# Patient Record
Sex: Female | Born: 1980 | Race: White | Hispanic: No | Marital: Married | Smoking: Never smoker
Health system: Southern US, Community
[De-identification: ages and names within clinical notes are randomized; demographics above are authoritative.]

## PROBLEM LIST (undated history)

## (undated) DIAGNOSIS — J45909 Unspecified asthma, uncomplicated: Secondary | ICD-10-CM

## (undated) DIAGNOSIS — A630 Anogenital (venereal) warts: Secondary | ICD-10-CM

## (undated) DIAGNOSIS — F419 Anxiety disorder, unspecified: Secondary | ICD-10-CM

## (undated) DIAGNOSIS — R87629 Unspecified abnormal cytological findings in specimens from vagina: Secondary | ICD-10-CM

## (undated) DIAGNOSIS — Z789 Other specified health status: Secondary | ICD-10-CM

## (undated) HISTORY — PX: NO PAST SURGERIES: SHX2092

## (undated) HISTORY — DX: Unspecified asthma, uncomplicated: J45.909

## (undated) HISTORY — DX: Anogenital (venereal) warts: A63.0

## (undated) HISTORY — DX: Unspecified abnormal cytological findings in specimens from vagina: R87.629

## (undated) HISTORY — DX: Anxiety disorder, unspecified: F41.9

---
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
## 2004-02-18 ENCOUNTER — Encounter: Admission: RE | Admit: 2004-02-18 | Discharge: 2004-03-31 | Payer: Self-pay | Admitting: Orthopedic Surgery

## 2006-06-28 ENCOUNTER — Other Ambulatory Visit: Admission: RE | Admit: 2006-06-28 | Discharge: 2006-06-28 | Payer: Self-pay | Admitting: Obstetrics & Gynecology

## 2007-08-22 ENCOUNTER — Other Ambulatory Visit: Admission: RE | Admit: 2007-08-22 | Discharge: 2007-08-22 | Payer: Self-pay | Admitting: Obstetrics and Gynecology

## 2009-06-25 ENCOUNTER — Inpatient Hospital Stay (HOSPITAL_COMMUNITY): Admission: AD | Admit: 2009-06-25 | Discharge: 2009-06-27 | Payer: Self-pay | Admitting: Obstetrics and Gynecology

## 2010-07-19 LAB — OB RESULTS CONSOLE GC/CHLAMYDIA: Chlamydia: NEGATIVE

## 2010-08-14 LAB — CBC
HCT: 38 % (ref 36.0–46.0)
HCT: 45.6 % (ref 36.0–46.0)
Hemoglobin: 12.9 g/dL (ref 12.0–15.0)
Hemoglobin: 15.1 g/dL — ABNORMAL HIGH (ref 12.0–15.0)
MCHC: 33 g/dL (ref 30.0–36.0)
MCHC: 33.9 g/dL (ref 30.0–36.0)
MCV: 100.1 fL — ABNORMAL HIGH (ref 78.0–100.0)
MCV: 99.3 fL (ref 78.0–100.0)
Platelets: 174 10*3/uL (ref 150–400)
Platelets: 176 10*3/uL (ref 150–400)
RBC: 3.82 MIL/uL — ABNORMAL LOW (ref 3.87–5.11)
RBC: 4.56 MIL/uL (ref 3.87–5.11)
RDW: 13 % (ref 11.5–15.5)
RDW: 13.2 % (ref 11.5–15.5)
WBC: 17.5 10*3/uL — ABNORMAL HIGH (ref 4.0–10.5)
WBC: 22.8 10*3/uL — ABNORMAL HIGH (ref 4.0–10.5)

## 2010-08-14 LAB — RPR: RPR Ser Ql: NONREACTIVE

## 2010-10-13 NOTE — Op Note (Signed)
[place]. [facility]  Patient:    Maria Salinas, Maria Salinas                       MRN: [id number] Proc. Date: 12/10/00 Adm. Date:  [id number] Attending:  Ronne Binning                           Operative Report  PREOPERATIVE DIAGNOSIS:  Flexor sheath cyst, left ring finger.  POSTOPERATIVE DIAGNOSIS:  Flexor sheath cyst, left ring finger.  PROCEDURE:  Excision flexor sheath cyst, left ring finger.  SURGEON:  Nicki Reaper, M.D.  ASSISTANT:  Joaquin Courts, R.N.  ANESTHESIA:  Forearm-based IV regional.  ANESTHESIOLOGIST:  Halford Decamp, M.D.  HISTORY:  The patient is an 30 year old female with a history of mass in the volar aspect of the metacarpophalangeal joint of her left ring finger, which has not responded to conservative treatment.  DESCRIPTION OF PROCEDURE:  The patient was brought to the operating room, where a forearm-based IV regional anesthetic was carried out without difficulty.  She was prepped and draped using Betadine scrub and solution, the left arm free.  An oblique incision was made over the A1 pulley, carried down through subcutaneous tissue, neurovascular structures identified and protected.  The cyst was immediately encountered, and this was at the proximal aspect of the A1 pulley.  This entire portion of the pulley and cyst was excised.  The finger was placed through a full range of motion.  No further triggering was noted.  A small incision was made in the central aspect longitudinally in the remainder of the A1 pulley.  The wound was copiously irrigated with saline and the skin closed with interrupted 5-0 nylon suture. Sterile compressive dressing was applied.  The patient tolerated the procedure well and was taken to the recovery room for observation in satisfactory condition.  She is discharged to home, to return to the [facility] [place] in one week, on Vicodin and Keflex. DD:  12/10/00 TD:  12/10/00 Job:  [id number]

## 2010-11-06 LAB — OB RESULTS CONSOLE GC/CHLAMYDIA
Chlamydia: NEGATIVE
Gonorrhea: NEGATIVE

## 2010-11-06 LAB — OB RESULTS CONSOLE HEPATITIS B SURFACE ANTIGEN: Hepatitis B Surface Ag: NEGATIVE

## 2010-11-06 LAB — OB RESULTS CONSOLE RPR: RPR: NONREACTIVE

## 2010-11-06 LAB — OB RESULTS CONSOLE ABO/RH: RH Type: POSITIVE

## 2010-11-06 LAB — OB RESULTS CONSOLE RUBELLA ANTIBODY, IGM: Rubella: IMMUNE

## 2011-05-29 NOTE — L&D Delivery Note (Signed)
Delivery Note  a viable female was delivered via  OA.  APGAR: 9 9  Placenta status: delivered spontaneously , intact with 3 vessel cord.  Cord:  with the following complications: none.  Cord pH: not done  Anesthesia: Epidural  Episiotomy: none Lacerations: none Suture Repair: not applicable Est. Blood Loss (mL): 300  Mom to postpartum.  Baby to nursery-stable.  Kiaya Haliburton L 03/02/2012, 1:40 PM

## 2011-07-20 LAB — OB RESULTS CONSOLE ABO/RH

## 2011-07-20 LAB — OB RESULTS CONSOLE ANTIBODY SCREEN: Antibody Screen: NEGATIVE

## 2011-08-10 LAB — OB RESULTS CONSOLE GC/CHLAMYDIA
Chlamydia: NEGATIVE
Gonorrhea: NEGATIVE

## 2011-11-06 LAB — OB RESULTS CONSOLE ANTIBODY SCREEN: Antibody Screen: NEGATIVE

## 2011-11-06 LAB — OB RESULTS CONSOLE GC/CHLAMYDIA: Chlamydia: NEGATIVE

## 2011-11-06 LAB — OB RESULTS CONSOLE RUBELLA ANTIBODY, IGM: Rubella: IMMUNE

## 2011-11-06 LAB — OB RESULTS CONSOLE ABO/RH: RH Type: POSITIVE

## 2011-11-06 LAB — OB RESULTS CONSOLE HEPATITIS B SURFACE ANTIGEN: Hepatitis B Surface Ag: NEGATIVE

## 2012-03-02 ENCOUNTER — Inpatient Hospital Stay (HOSPITAL_COMMUNITY): Payer: 59 | Admitting: Anesthesiology

## 2012-03-02 ENCOUNTER — Encounter (HOSPITAL_COMMUNITY): Payer: Self-pay | Admitting: Anesthesiology

## 2012-03-02 ENCOUNTER — Encounter (HOSPITAL_COMMUNITY): Payer: Self-pay | Admitting: Obstetrics and Gynecology

## 2012-03-02 ENCOUNTER — Inpatient Hospital Stay (HOSPITAL_COMMUNITY)
Admission: AD | Admit: 2012-03-02 | Discharge: 2012-03-03 | DRG: 775 | Disposition: A | Discharge status: Home / Self Care | Payer: 59 | Source: Ambulatory Visit | Attending: Obstetrics and Gynecology | Admitting: Obstetrics and Gynecology

## 2012-03-02 DIAGNOSIS — O99892 Other specified diseases and conditions complicating childbirth: Principal | ICD-10-CM | POA: Diagnosis present

## 2012-03-02 DIAGNOSIS — Z22330 Carrier of Group B streptococcus: Secondary | ICD-10-CM

## 2012-03-02 HISTORY — DX: Other specified health status: Z78.9

## 2012-03-02 LAB — CBC
MCH: 31.8 pg (ref 26.0–34.0)
MCV: 96.4 fL (ref 78.0–100.0)
Platelets: 161 10*3/uL (ref 150–400)
RDW: 13 % (ref 11.5–15.5)

## 2012-03-02 MED ORDER — TETANUS-DIPHTH-ACELL PERTUSSIS 5-2.5-18.5 LF-MCG/0.5 IM SUSP: 0.5000 mL | Freq: Once | INTRAMUSCULAR | Status: DC

## 2012-03-02 MED ORDER — LACTATED RINGERS IV SOLN
INTRAVENOUS | Status: DC
  Administered 2012-03-02: 09:00:00 via INTRAVENOUS
  Administered 2012-03-02: 125 mL via INTRAVENOUS

## 2012-03-02 MED ORDER — OXYCODONE-ACETAMINOPHEN 5-325 MG PO TABS: 1.0000 | ORAL_TABLET | ORAL | Status: DC | PRN

## 2012-03-02 MED ORDER — SENNOSIDES-DOCUSATE SODIUM 8.6-50 MG PO TABS
2.0000 | ORAL_TABLET | Freq: Every day | ORAL | Status: DC
  Administered 2012-03-02: 2 via ORAL

## 2012-03-02 MED ORDER — OXYTOCIN 40 UNITS IN LACTATED RINGERS INFUSION - SIMPLE MED
62.5000 mL/h | Freq: Once | INTRAVENOUS | Status: AC
  Administered 2012-03-02: 62.5 mL/h via INTRAVENOUS
  Filled 2012-03-02: qty 1000

## 2012-03-02 MED ORDER — LANOLIN HYDROUS EX OINT: TOPICAL_OINTMENT | CUTANEOUS | Status: DC | PRN

## 2012-03-02 MED ORDER — MEASLES, MUMPS & RUBELLA VAC ~~LOC~~ INJ
0.5000 mL | INJECTION | Freq: Once | SUBCUTANEOUS | Status: DC
  Filled 2012-03-02: qty 0.5

## 2012-03-02 MED ORDER — FENTANYL 2.5 MCG/ML BUPIVACAINE 1/10 % EPIDURAL INFUSION (WH - ANES)
14.0000 mL/h | INTRAMUSCULAR | Status: DC
  Filled 2012-03-02: qty 123

## 2012-03-02 MED ORDER — CITRIC ACID-SODIUM CITRATE 334-500 MG/5ML PO SOLN: 30.0000 mL | ORAL | Status: DC | PRN

## 2012-03-02 MED ORDER — BISACODYL 10 MG RE SUPP: 10.0000 mg | Freq: Every day | RECTAL | Status: DC | PRN

## 2012-03-02 MED ORDER — INFLUENZA VIRUS VACC SPLIT PF IM SUSP
0.5000 mL | INTRAMUSCULAR | Status: AC
  Administered 2012-03-03: 0.5 mL via INTRAMUSCULAR
  Filled 2012-03-02: qty 0.5

## 2012-03-02 MED ORDER — DIPHENHYDRAMINE HCL 50 MG/ML IJ SOLN: 12.5000 mg | INTRAMUSCULAR | Status: DC | PRN

## 2012-03-02 MED ORDER — SODIUM BICARBONATE 8.4 % IV SOLN
INTRAVENOUS | Status: DC | PRN
  Administered 2012-03-02: 5 mL via EPIDURAL

## 2012-03-02 MED ORDER — OXYTOCIN BOLUS FROM INFUSION
500.0000 mL | Freq: Once | INTRAVENOUS | Status: AC
  Administered 2012-03-02: 500 mL via INTRAVENOUS
  Filled 2012-03-02: qty 500

## 2012-03-02 MED ORDER — PHENYLEPHRINE 40 MCG/ML (10ML) SYRINGE FOR IV PUSH (FOR BLOOD PRESSURE SUPPORT): 80.0000 ug | PREFILLED_SYRINGE | INTRAVENOUS | Status: DC | PRN

## 2012-03-02 MED ORDER — LACTATED RINGERS IV SOLN: 500.0000 mL | Freq: Once | INTRAVENOUS | Status: DC

## 2012-03-02 MED ORDER — SIMETHICONE 80 MG PO CHEW: 80.0000 mg | CHEWABLE_TABLET | ORAL | Status: DC | PRN

## 2012-03-02 MED ORDER — ACETAMINOPHEN 325 MG PO TABS: 650.0000 mg | ORAL_TABLET | ORAL | Status: DC | PRN

## 2012-03-02 MED ORDER — ONDANSETRON HCL 4 MG PO TABS: 4.0000 mg | ORAL_TABLET | ORAL | Status: DC | PRN

## 2012-03-02 MED ORDER — PRENATAL MULTIVITAMIN CH
1.0000 | ORAL_TABLET | Freq: Every day | ORAL | Status: DC
  Administered 2012-03-02 – 2012-03-03 (×2): 1 via ORAL
  Filled 2012-03-02 (×2): qty 1

## 2012-03-02 MED ORDER — IBUPROFEN 600 MG PO TABS
600.0000 mg | ORAL_TABLET | Freq: Four times a day (QID) | ORAL | Status: DC
  Administered 2012-03-02 – 2012-03-03 (×5): 600 mg via ORAL
  Filled 2012-03-02 (×4): qty 1

## 2012-03-02 MED ORDER — LACTATED RINGERS IV SOLN: 500.0000 mL | INTRAVENOUS | Status: DC | PRN

## 2012-03-02 MED ORDER — IBUPROFEN 600 MG PO TABS
600.0000 mg | ORAL_TABLET | Freq: Four times a day (QID) | ORAL | Status: DC | PRN
  Filled 2012-03-02: qty 1

## 2012-03-02 MED ORDER — MEDROXYPROGESTERONE ACETATE 150 MG/ML IM SUSP: 150.0000 mg | INTRAMUSCULAR | Status: DC | PRN

## 2012-03-02 MED ORDER — BENZOCAINE-MENTHOL 20-0.5 % EX AERO
1.0000 "application " | INHALATION_SPRAY | CUTANEOUS | Status: DC | PRN
  Administered 2012-03-03: 1 via TOPICAL
  Filled 2012-03-02: qty 56

## 2012-03-02 MED ORDER — FENTANYL 2.5 MCG/ML BUPIVACAINE 1/10 % EPIDURAL INFUSION (WH - ANES)
INTRAMUSCULAR | Status: DC | PRN
  Administered 2012-03-02: 14 mL/h via EPIDURAL

## 2012-03-02 MED ORDER — EPHEDRINE 5 MG/ML INJ: 10.0000 mg | INTRAVENOUS | Status: DC | PRN

## 2012-03-02 MED ORDER — ONDANSETRON HCL 4 MG/2ML IJ SOLN: 4.0000 mg | INTRAMUSCULAR | Status: DC | PRN

## 2012-03-02 MED ORDER — FLEET ENEMA 7-19 GM/118ML RE ENEM: 1.0000 | ENEMA | Freq: Every day | RECTAL | Status: DC | PRN

## 2012-03-02 MED ORDER — WITCH HAZEL-GLYCERIN EX PADS: 1.0000 "application " | MEDICATED_PAD | CUTANEOUS | Status: DC | PRN

## 2012-03-02 MED ORDER — ONDANSETRON HCL 4 MG/2ML IJ SOLN: 4.0000 mg | Freq: Four times a day (QID) | INTRAMUSCULAR | Status: DC | PRN

## 2012-03-02 MED ORDER — LIDOCAINE HCL (PF) 1 % IJ SOLN
30.0000 mL | INTRAMUSCULAR | Status: DC | PRN
  Filled 2012-03-02: qty 30

## 2012-03-02 MED ORDER — DIBUCAINE 1 % RE OINT: 1.0000 "application " | TOPICAL_OINTMENT | RECTAL | Status: DC | PRN

## 2012-03-02 MED ORDER — SODIUM CHLORIDE 0.9 % IV SOLN
2.0000 g | Freq: Four times a day (QID) | INTRAVENOUS | Status: DC
  Administered 2012-03-02: 2 g via INTRAVENOUS
  Filled 2012-03-02 (×4): qty 2000

## 2012-03-02 MED ORDER — ZOLPIDEM TARTRATE 5 MG PO TABS: 5.0000 mg | ORAL_TABLET | Freq: Every evening | ORAL | Status: DC | PRN

## 2012-03-02 MED ORDER — CLINDAMYCIN PHOSPHATE 900 MG/50ML IV SOLN: 900.0000 mg | Freq: Once | INTRAVENOUS | Status: DC

## 2012-03-02 MED ORDER — PHENYLEPHRINE 40 MCG/ML (10ML) SYRINGE FOR IV PUSH (FOR BLOOD PRESSURE SUPPORT)
80.0000 ug | PREFILLED_SYRINGE | INTRAVENOUS | Status: DC | PRN
  Filled 2012-03-02: qty 5

## 2012-03-02 MED ORDER — DIPHENHYDRAMINE HCL 25 MG PO CAPS: 25.0000 mg | ORAL_CAPSULE | Freq: Four times a day (QID) | ORAL | Status: DC | PRN

## 2012-03-02 MED ORDER — EPHEDRINE 5 MG/ML INJ
10.0000 mg | INTRAVENOUS | Status: DC | PRN
  Filled 2012-03-02: qty 4

## 2012-03-02 NOTE — Progress Notes (Signed)
Pt placed on bedpan

## 2012-03-02 NOTE — Anesthesia Postprocedure Evaluation (Signed)
  Anesthesia Post-op Note  Patient: Retail banker  Procedure(s) Performed: * No procedures listed *  Patient Location: PACU and Mother/Baby  Anesthesia Type: Epidural  Level of Consciousness: awake, alert  and oriented  Airway and Oxygen Therapy: Patient Spontanous Breathing    Post-op Assessment: Patient's Cardiovascular Status Stable and Respiratory Function Stable  Post-op Vital Signs: stable  Complications: No apparent anesthesia complications

## 2012-03-02 NOTE — Progress Notes (Signed)
Up to bathroom with steady 

## 2012-03-02 NOTE — Anesthesia Preprocedure Evaluation (Signed)

## 2012-03-02 NOTE — Progress Notes (Signed)
31 year old G 2 P 1001 at 40 w 5 days presents with contractions PNC see [facility] GBBS +  NSVD x 1  Afebrile Vital signs stable General alert and oriented Lung CTAB Car RRR Abdomen is soft and non tender Pelvic is 100%/5 / - 1 Vertex BOWI  Impression: IUP at 40 w 5 days Labor GBBS +  Plan: Admit Epidural AROM Ampicillin

## 2012-03-02 NOTE — Progress Notes (Signed)
800 cc urine

## 2012-03-02 NOTE — Progress Notes (Signed)
Pt used restroom prior to monitors placed

## 2012-03-02 NOTE — Anesthesia Procedure Notes (Signed)

## 2012-03-02 NOTE — Progress Notes (Signed)
Pt unable to void.  nsy here to examine baby.  Will I/O cath after nsy done with baby

## 2012-03-02 NOTE — MAU Note (Signed)
Pt presents to MAU with chief complaint of contractions/labor eval. Pt says the contractions started last night around 2200. Pt is a G2P1 @ [redacted]w[redacted]d.

## 2012-03-03 LAB — CBC
MCV: 97.8 fL (ref 78.0–100.0)
Platelets: 169 10*3/uL (ref 150–400)
RBC: 4.15 MIL/uL (ref 3.87–5.11)
RDW: 13.2 % (ref 11.5–15.5)
WBC: 15.6 10*3/uL — ABNORMAL HIGH (ref 4.0–10.5)

## 2012-03-03 MED ORDER — IBUPROFEN 600 MG PO TABS: 600.0000 mg | ORAL_TABLET | Freq: Four times a day (QID) | ORAL | Status: DC

## 2012-03-03 NOTE — Discharge Summary (Signed)
Obstetric Discharge Summary Reason for Admission: onset of labor Prenatal Procedures: ultrasound Intrapartum Procedures: spontaneous vaginal delivery Postpartum Procedures: none Complications-Operative and Postpartum: none Hemoglobin  Date Value Range Status  03/03/2012 13.5  12.0 - 15.0 g/dL Final     HCT  Date Value Range Status  03/03/2012 40.6  36.0 - 46.0 % Final    Physical Exam:  General: alert and cooperative Lochia: appropriate Uterine Fundus: firm Incision: perineum intact DVT Evaluation: No evidence of DVT seen on physical exam. Negative Homan's sign. No cords or calf tenderness.  Discharge Diagnoses: Term Pregnancy-delivered  Discharge Information: Date: 03/03/2012 Activity: pelvic rest Diet: routine Medications: PNV and Ibuprofen Condition: stable Instructions: refer to practice specific booklet Discharge to: home   Newborn Data: Live born female  Birth Weight: 7 lb 9.6 oz (3447 g) APGAR: 8, 9  Home with mother.  CURTIS,CAROL G 03/03/2012, 8:42 AM

## 2012-03-03 NOTE — Progress Notes (Signed)
Post Partum Day 1 Subjective: no complaints, up ad lib, voiding, tolerating PO and + flatus  Objective: Blood pressure 110/53, pulse 66, temperature 98 F (36.7 C), temperature source Oral, resp. rate 18, height 5\' 4"  (1.626 m), weight 63.05 kg (139 lb), SpO2 97.00%, unknown if currently breastfeeding.  Physical Exam:  General: alert and cooperative Lochia: appropriate Uterine Fundus: firm Incision: perineum intact DVT Evaluation: No evidence of DVT seen on physical exam. Negative Homan's sign.   Basename 03/03/12 0537 03/02/12 0840  HGB 13.5 14.2  HCT 40.6 43.1    Assessment/Plan: Discharge home   LOS: 1 day   CURTIS,CAROL G 03/03/2012, 7:59 AM

## 2012-03-04 ENCOUNTER — Inpatient Hospital Stay (HOSPITAL_COMMUNITY): Admission: RE | Admit: 2012-03-04 | Payer: 59 | Source: Ambulatory Visit

## 2014-03-29 ENCOUNTER — Encounter (HOSPITAL_COMMUNITY): Payer: Self-pay | Admitting: Obstetrics and Gynecology

## 2014-04-28 ENCOUNTER — Ambulatory Visit (INDEPENDENT_AMBULATORY_CARE_PROVIDER_SITE_OTHER): Payer: 59 | Admitting: Family Medicine

## 2014-04-28 VITALS — BP 108/70 | HR 87 | Temp 99.8°F | Resp 16 | Ht 65.75 in | Wt 120.0 lb

## 2014-04-28 DIAGNOSIS — M6249 Contracture of muscle, multiple sites: Secondary | ICD-10-CM

## 2014-04-28 DIAGNOSIS — M542 Cervicalgia: Secondary | ICD-10-CM

## 2014-04-28 DIAGNOSIS — M62838 Other muscle spasm: Secondary | ICD-10-CM

## 2014-04-28 MED ORDER — DICLOFENAC SODIUM 75 MG PO TBEC: 75.0000 mg | DELAYED_RELEASE_TABLET | Freq: Two times a day (BID) | ORAL | Status: DC

## 2014-04-28 MED ORDER — METAXALONE 800 MG PO TABS: 800.0000 mg | ORAL_TABLET | Freq: Three times a day (TID) | ORAL | Status: DC

## 2014-04-28 MED ORDER — TRAMADOL HCL 50 MG PO TABS: 50.0000 mg | ORAL_TABLET | Freq: Four times a day (QID) | ORAL | Status: DC | PRN

## 2014-04-28 NOTE — Progress Notes (Signed)
Subjective: 33 year old lady who has been having neck pain for the last 5 days. It started with a milder pain in the left neck and upper back region. It was tolerable for Saturday and Sunday, then Monday got acutely severe and is persisted with hurting a lot. She has taken some OTC ibuprofen 800 mg. The pain has gotten up to as high as about a 8 or 9 out of 10 score and resting when she is in a immobile position is about a 4 out of 10. Any motion makes it hurt worse. She knows of no specific injury. Has been living a routine life, with Christmas decorating before this happened. 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 No history of any remote accidents or injuries to her neck that she can recall.  She is on oral contraceptives, is not breast-feeding any longer.  Objective: Healthy-appearing lady in no major distress. Does have pain stressed look especially to the left. Cervical motion causes pain with right lateral tilt left lateral tilt or right or left rotation. The pain is primarily located on the left still. Anterior and posterior flexion and extension do not seem to hurt terribly. She is not tender along the cervical spine itself, but along the paraspinous muscles. No defined major single point tenderness. Range of motion of shoulders is good though full abduction causes some pain and grip is good.  Assessment: Left cervical strain, spasm, and pain  Plan: Skelaxin, diclofenac, and tramadol. Heat or ice and heat Return if not improving or if acutely worse at anytime

## 2014-04-28 NOTE — Patient Instructions (Signed)
Take the Skelaxin one half to one pill 3 times daily for muscle relaxant (metaxalone)  Take the diclofenac one twice daily with breakfast and supper for pain and inflammation. Do not take ibuprofen when taking  This.  Take the tramadol 1 every 6 or 8 hours as needed for more severe pain  You can take some Tylenol for milder pain in addition to the diclofenac if necessary right of than the tramadol.  Apply heat or ice and heat. Return as needed.

## 2014-07-02 ENCOUNTER — Encounter: Payer: Self-pay | Admitting: *Deleted

## 2014-08-04 ENCOUNTER — Ambulatory Visit: Payer: 59 | Admitting: Family Medicine

## 2014-08-09 ENCOUNTER — Emergency Department (HOSPITAL_COMMUNITY)
Admission: EM | Admit: 2014-08-09 | Discharge: 2014-08-09 | Disposition: A | Discharge status: Home / Self Care | Payer: 59 | Source: Home / Self Care | Attending: Family Medicine | Admitting: Family Medicine

## 2014-08-09 ENCOUNTER — Encounter (HOSPITAL_COMMUNITY): Payer: Self-pay | Admitting: Emergency Medicine

## 2014-08-09 DIAGNOSIS — J329 Chronic sinusitis, unspecified: Secondary | ICD-10-CM

## 2014-08-09 DIAGNOSIS — J310 Chronic rhinitis: Secondary | ICD-10-CM

## 2014-08-09 MED ORDER — AMOXICILLIN 500 MG PO CAPS: 1000.0000 mg | ORAL_CAPSULE | Freq: Two times a day (BID) | ORAL | Status: DC

## 2014-08-09 MED ORDER — PREDNISONE 20 MG PO TABS: ORAL_TABLET | ORAL | Status: DC

## 2014-08-09 NOTE — ED Notes (Signed)
Pt states that she has had sinus pain with upper respiratory symptoms since 07/27/2014

## 2014-08-09 NOTE — ED Provider Notes (Signed)
CSN: [id number]     Arrival date & time 08/09/14  [id number] History   First MD Initiated Contact with Patient 08/09/14 1058     Chief Complaint  Patient presents with  . URI  . Sinus Problem   (Consider location/radiation/quality/duration/timing/severity/associated sxs/prior Treatment) HPI Comments: 34 year old female states that she has had URI symptoms for approximately 2 weeks. In the last few days she has been experiencing primarily left facial pain with Teeth ache-like pain on the left, but she will congestion, facial pain, PND, cough. She has been taking Delsym and ibuprofen. She has not taken any medications for sinus pressure or drainage.   Past Medical History  Diagnosis Date  . No pertinent past medical history   . Asthma   . Anxiety    Past Surgical History  Procedure Laterality Date  . No past surgeries     Family History  Problem Relation Age of Onset  . Cancer Mother   . Alcohol abuse Maternal Grandfather   . Asthma Paternal Grandmother    History  Substance Use Topics  . Smoking status: Never Smoker   . Smokeless tobacco: Never Used  . Alcohol Use: 0.6 oz/week    0 Standard drinks or equivalent, 1 Glasses of wine per week   OB History    Gravida Para Term Preterm AB TAB SAB Ectopic Multiple Living   2 2 2  0 0 0 0 0 0 2     Review of Systems  Constitutional: Negative.   HENT: Positive for congestion, postnasal drip and sinus pressure. Negative for facial swelling, sore throat and trouble swallowing.   Respiratory: Positive for cough. Negative for shortness of breath and wheezing.   Gastrointestinal: Negative.   Skin: Negative.   All other systems reviewed and are negative.   Allergies  Review of patient's allergies indicates no known allergies.  Home Medications   Prior to Admission medications   Medication Sig Start Date End Date Taking? Authorizing Provider  MARLISSA 0.15-30 MG-MCG tablet  03/29/14  Yes Historical Provider, MD  amoxicillin (AMOXIL)  500 MG capsule Take 2 capsules (1,000 mg total) by mouth 2 (two) times daily. 08/09/14   Hayden Rasmussenavid Kerensa Nicklas, NP  predniSONE (DELTASONE) 20 MG tablet Take 3 tabs po on first day, 2 tabs second day, 2 tabs third day, 1 tab fourth day, 1 tab 5th day. Take with food. 08/09/14   Hayden Rasmussenavid Oneal Schoenberger, NP   BP 166/84 mmHg  Pulse 72  Temp(Src) 99.2 F (37.3 C) (Oral)  Resp 16  SpO2 99%  LMP 07/21/2014 Physical Exam  Constitutional: She is oriented to person, place, and time. She appears well-developed and well-nourished. No distress.  HENT:  Mouth/Throat: No oropharyngeal exudate.  Bilateral TMs are normal Oropharynx with mild, light streaky erythema.  Eyes: Conjunctivae and EOM are normal.  Neck: Normal range of motion. Neck supple.  Cardiovascular: Normal rate, regular rhythm and normal heart sounds.   Pulmonary/Chest: Effort normal and breath sounds normal. No respiratory distress. She has no wheezes.  Musculoskeletal: Normal range of motion. She exhibits no edema.  Lymphadenopathy:    She has no cervical adenopathy.  Neurological: She is alert and oriented to person, place, and time.  Skin: Skin is warm and dry.  Psychiatric: She has a normal mood and affect.  Nursing note and vitals reviewed.   ED Course  Procedures (including critical care time) Labs Review Labs Reviewed - No data to display  Imaging Review No results found.   MDM   1. Rhinosinusitis  recommend holding off on taking the amoxicillin for a couple days to see if the OTC medication and the prednisone will help. If you are getting relief from this approach recommend not taking the antibiotic.  Sudafed PE 10 mg  Allegra or Zyrtec for drainage Lots of saline nasal spray Prednisone taper Drink lots of fluids    Hayden Rasmussen, NP 08/09/14 1138

## 2014-08-09 NOTE — Discharge Instructions (Signed)
Sinusitis Sudafed PE 10 mg  Allegra or Zyrtec for drainage Lots of saline nasal spray Prednisone taper Drink lots of fluids Upper Respiratory Infection, Adult An upper respiratory infection (URI) is also known as the common cold. It is often caused by a type of germ (virus). Colds are easily spread (contagious). You can pass it to others by kissing, coughing, sneezing, or drinking out of the same glass. Usually, you get better in 1 or 2 weeks.  HOME CARE   Only take medicine as told by your doctor.  Use a warm mist humidifier or breathe in steam from a hot shower.  Drink enough water and fluids to keep your pee (urine) clear or pale yellow.  Get plenty of rest.  Return to work when your temperature is back to normal or as told by your doctor. You may use a face mask and wash your hands to stop your cold from spreading. GET HELP RIGHT AWAY IF:   After the first few days, you feel you are getting worse.  You have questions about your medicine.  You have chills, shortness of breath, or brown or red spit (mucus).  You have yellow or brown snot (nasal discharge) or pain in the face, especially when you bend forward.  You have a fever, puffy (swollen) neck, pain when you swallow, or white spots in the back of your throat.  You have a bad headache, ear pain, sinus pain, or chest pain.  You have a high-pitched whistling sound when you breathe in and out (wheezing).  You have a lasting cough or cough up blood.  You have sore muscles or a stiff neck. MAKE SURE YOU:   Understand these instructions.  Will watch your condition.  Will get help right away if you are not doing well or get worse. Document Released: 10/31/2007 Document Revised: 08/06/2011 Document Reviewed: 08/19/2013 [facility] Patient Information 2015 [place], [place]. This information is not intended to replace advice given to you by your health care provider. Make sure you discuss any questions you have with your health  care provider.  Sinusitis is redness, soreness, and inflammation of the paranasal sinuses. Paranasal sinuses are air pockets within the bones of your face (beneath the eyes, the middle of the forehead, or above the eyes). In healthy paranasal sinuses, mucus is able to drain out, and air is able to circulate through them by way of your nose. However, when your paranasal sinuses are inflamed, mucus and air can become trapped. This can allow bacteria and other germs to grow and cause infection. Sinusitis can develop quickly and last only a short time (acute) or continue over a long period (chronic). Sinusitis that lasts for more than 12 weeks is considered chronic.  CAUSES  Causes of sinusitis include:  Allergies.  Structural abnormalities, such as displacement of the cartilage that separates your nostrils (deviated septum), which can decrease the air flow through your nose and sinuses and affect sinus drainage.  Functional abnormalities, such as when the small hairs (cilia) that line your sinuses and help remove mucus do not work properly or are not present. SIGNS AND SYMPTOMS  Symptoms of acute and chronic sinusitis are the same. The primary symptoms are pain and pressure around the affected sinuses. Other symptoms include:  Upper toothache.  Earache.  Headache.  Bad breath.  Decreased sense of smell and taste.  A cough, which worsens when you are lying flat.  Fatigue.  Fever.  Thick drainage from your nose, which often is green  and may contain pus (purulent).  Swelling and warmth over the affected sinuses. DIAGNOSIS  Your health care provider will perform a physical exam. During the exam, your health care provider may:  Look in your nose for signs of abnormal growths in your nostrils (nasal polyps).  Tap over the affected sinus to check for signs of infection.  View the inside of your sinuses (endoscopy) using an imaging device that has a light attached (endoscope). If your  health care provider suspects that you have chronic sinusitis, one or more of the following tests may be recommended:  Allergy tests.  Nasal culture. A sample of mucus is taken from your nose, sent to a lab, and screened for bacteria.  Nasal cytology. A sample of mucus is taken from your nose and examined by your health care provider to determine if your sinusitis is related to an allergy. TREATMENT  Most cases of acute sinusitis are related to a viral infection and will resolve on their own within 10 days. Sometimes medicines are prescribed to help relieve symptoms (pain medicine, decongestants, nasal steroid sprays, or saline sprays).  However, for sinusitis related to a bacterial infection, your health care provider will prescribe antibiotic medicines. These are medicines that will help kill the bacteria causing the infection.  Rarely, sinusitis is caused by a fungal infection. In theses cases, your health care provider will prescribe antifungal medicine. For some cases of chronic sinusitis, surgery is needed. Generally, these are cases in which sinusitis recurs more than 3 times per year, despite other treatments. HOME CARE INSTRUCTIONS   Drink plenty of water. Water helps thin the mucus so your sinuses can drain more easily.  Use a humidifier.  Inhale steam 3 to 4 times a day (for example, sit in the bathroom with the shower running).  Apply a warm, moist washcloth to your face 3 to 4 times a day, or as directed by your health care provider.  Use saline nasal sprays to help moisten and clean your sinuses.  Take medicines only as directed by your health care provider.  If you were prescribed either an antibiotic or antifungal medicine, finish it all even if you start to feel better. SEEK IMMEDIATE MEDICAL CARE IF:  You have increasing pain or severe headaches.  You have nausea, vomiting, or drowsiness.  You have swelling around your face.  You have vision problems.  You have  a stiff neck.  You have difficulty breathing. MAKE SURE YOU:   Understand these instructions.  Will watch your condition.  Will get help right away if you are not doing well or get worse. Document Released: 05/14/2005 Document Revised: 09/28/2013 Document Reviewed: 05/29/2011 [facility] Patient Information 2015 [place]. This information is not intended to replace advice given to you by your health care provider. Make sure you discuss any questions you have with your health care provider.

## 2014-08-23 ENCOUNTER — Encounter: Payer: Self-pay | Admitting: Family Medicine

## 2014-08-23 ENCOUNTER — Ambulatory Visit (INDEPENDENT_AMBULATORY_CARE_PROVIDER_SITE_OTHER): Payer: 59 | Admitting: Family Medicine

## 2014-08-23 VITALS — BP 116/60 | HR 78 | Temp 98.4°F | Resp 14 | Ht 66.0 in | Wt 120.0 lb

## 2014-08-23 DIAGNOSIS — Z0000 Encounter for general adult medical examination without abnormal findings: Secondary | ICD-10-CM | POA: Diagnosis not present

## 2014-08-23 DIAGNOSIS — R079 Chest pain, unspecified: Secondary | ICD-10-CM

## 2014-08-23 NOTE — Patient Instructions (Addendum)
Release of records-- [facility] - Last PAP Smear any Labs Return for fasting labs  Call if symptoms worsen F/U as needed or in 1 year

## 2014-08-23 NOTE — Progress Notes (Signed)
Patient ID: Rolland BimlerCarly Brandner, female   DOB: 1981-04-05, 34 y.o.   MRN: [id number]   Subjective:    Patient ID: Rolland BimlerCarly Saunders, female    DOB: 1981-04-05, 34 y.o.   MRN: [id number]  Patient presents for New Patient CPE  Pt here to establish care, no previous PCP. GYN- Dr. Vickey SagesAtkins and [facility]  History of asthma as a child, no adult problems, treated for anxiety 15 years ago when mother passed away from breast cancer.  Today complains of chest pressure on right side, feels like a heartburn, non radiating can last up to 1 hour until she de stresses. Typically comes during anxious times or stressful times. 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 Symptoms have been present for 3-4 months. Note she has not felt any masses on the chest and had recent GYN exam which was normal. Denies any symptoms with regards to eating, no SOB, diaphoresis or nausea associated.  NO meds taken at time of symptoms.  Followed by dentist No fasting labs done in past 5 years Immunizations UTD    Review Of Systems:  GEN- denies fatigue, fever, weight loss,weakness, recent illness HEENT- denies eye drainage, change in vision, nasal discharge, CVS- +chest pain, palpitations RESP- denies SOB, cough, wheeze ABD- denies N/V, change in stools, abd pain GU- denies dysuria, hematuria, dribbling, incontinence MSK- denies joint pain, muscle aches, injury Neuro- denies headache, dizziness, syncope, seizure activity       Objective:    BP 116/60 mmHg  Pulse 78  Temp(Src) 98.4 F (36.9 C) (Oral)  Resp 14  Ht 5\' 6"  (1.676 m)  Wt 120 lb (54.432 kg)  BMI 19.38 kg/m2  LMP 08/12/2014 (Approximate) GEN- NAD, alert and oriented x3 HEENT- PERRL, EOMI, non injected sclera, pink conjunctiva, MMM, oropharynx clear Neck- Supple, no thyromegaly CVS- RRR, no murmur RESP-CTAB ABD-NABS,soft,NT,ND Psych- Normal affect and mood EXT- No edema Pulses- Radial, DP- 2+  EKG- NSR  HR 63      Assessment &  Plan:      Problem List Items Addressed This Visit    None    Visit Diagnoses    Chest pain, unspecified chest pain type    -  Primary    Atypical, likley stressed releated, will get labs to make sure everything looks good, no cardic hx in family, no true GI symptoms, discussed if she has symptoms with food can try OTC zantac or tums, call if anything worsens    Relevant Orders    EKG 12-Lead (Completed)    Routine general medical examination at a health care facility        CPE done, GYN has done PAP, Mammogram Age 34, return for fasting labs, immunizations UTD    Relevant Orders    CBC with Differential/Platelet    Comprehensive metabolic panel    Lipid panel    TSH       Note: This dictation was prepared with Dragon dictation along with smaller phrase technology. Any transcriptional errors that result from this process are unintentional.

## 2014-09-30 ENCOUNTER — Other Ambulatory Visit: Payer: 59

## 2014-09-30 DIAGNOSIS — Z0000 Encounter for general adult medical examination without abnormal findings: Secondary | ICD-10-CM

## 2014-09-30 LAB — CBC WITH DIFFERENTIAL/PLATELET
BASOS ABS: 0 10*3/uL (ref 0.0–0.1)
BASOS PCT: 0 % (ref 0–1)
EOS PCT: 2 % (ref 0–5)
Eosinophils Absolute: 0.1 10*3/uL (ref 0.0–0.7)
HCT: 45.4 % (ref 36.0–46.0)
Hemoglobin: 15.4 g/dL — ABNORMAL HIGH (ref 12.0–15.0)
Lymphocytes Relative: 45 % (ref 12–46)
Lymphs Abs: 2.3 10*3/uL (ref 0.7–4.0)
MCH: 32.4 pg (ref 26.0–34.0)
MCHC: 33.9 g/dL (ref 30.0–36.0)
MCV: 95.4 fL (ref 78.0–100.0)
MONO ABS: 0.4 10*3/uL (ref 0.1–1.0)
MPV: 9.9 fL (ref 8.6–12.4)
Monocytes Relative: 7 % (ref 3–12)
NEUTROS ABS: 2.4 10*3/uL (ref 1.7–7.7)
NEUTROS PCT: 46 % (ref 43–77)
PLATELETS: 268 10*3/uL (ref 150–400)
RBC: 4.76 MIL/uL (ref 3.87–5.11)
RDW: 13.1 % (ref 11.5–15.5)
WBC: 5.2 10*3/uL (ref 4.0–10.5)

## 2014-10-01 LAB — LIPID PANEL
CHOLESTEROL: 190 mg/dL (ref 0–200)
HDL: 50 mg/dL (ref >=46)
LDL CALC: 126 mg/dL — AB (ref 0–99)
TRIGLYCERIDES: 70 mg/dL (ref <150)
Total CHOL/HDL Ratio: 3.8 Ratio
VLDL: 14 mg/dL (ref 0–40)

## 2014-10-01 LAB — COMPREHENSIVE METABOLIC PANEL
ALT: 14 U/L (ref 0–35)
AST: 13 U/L (ref 0–37)
Albumin: 4 g/dL (ref 3.5–5.2)
Alkaline Phosphatase: 48 U/L (ref 39–117)
BUN: 13 mg/dL (ref 6–23)
CALCIUM: 9 mg/dL (ref 8.4–10.5)
CO2: 21 meq/L (ref 19–32)
CREATININE: 0.91 mg/dL (ref 0.50–1.10)
Chloride: 105 mEq/L (ref 96–112)
GLUCOSE: 74 mg/dL (ref 70–99)
Potassium: 4.1 mEq/L (ref 3.5–5.3)
Sodium: 141 mEq/L (ref 135–145)
Total Bilirubin: 0.6 mg/dL (ref 0.2–1.2)
Total Protein: 6.8 g/dL (ref 6.0–8.3)

## 2014-10-01 LAB — TSH: TSH: 0.664 u[IU]/mL (ref 0.350–4.500)

## 2015-06-20 MED FILL — LEVONOR-ETH ESTRAD 0.15-0.0: 0.15-30 | 28 days supply | Qty: 28 | Fill #0

## 2015-07-01 DIAGNOSIS — Z01419 Encounter for gynecological examination (general) (routine) without abnormal findings: Secondary | ICD-10-CM | POA: Diagnosis not present

## 2015-07-01 DIAGNOSIS — Z6820 Body mass index (BMI) 20.0-20.9, adult: Secondary | ICD-10-CM | POA: Diagnosis not present

## 2015-07-15 MED FILL — LEVONOR-ETH ESTRAD 0.15-0.0: 0.15-30 | 84 days supply | Qty: 84 | Fill #0

## 2015-08-04 ENCOUNTER — Encounter: Payer: Self-pay | Admitting: Physician Assistant

## 2015-08-04 ENCOUNTER — Ambulatory Visit (INDEPENDENT_AMBULATORY_CARE_PROVIDER_SITE_OTHER): Payer: 59 | Admitting: Physician Assistant

## 2015-08-04 VITALS — BP 104/72 | HR 96 | Temp 99.3°F | Resp 18 | Wt 124.0 lb

## 2015-08-04 DIAGNOSIS — J988 Other specified respiratory disorders: Secondary | ICD-10-CM

## 2015-08-04 DIAGNOSIS — J029 Acute pharyngitis, unspecified: Secondary | ICD-10-CM | POA: Diagnosis not present

## 2015-08-04 DIAGNOSIS — B9689 Other specified bacterial agents as the cause of diseases classified elsewhere: Secondary | ICD-10-CM

## 2015-08-04 LAB — STREP GROUP A AG, W/REFLEX TO CULT: STREGTOCOCCUS GROUP A AG SCREEN: NOT DETECTED

## 2015-08-04 MED ORDER — HYDROCODONE-HOMATROPINE 5-1.5 MG/5ML PO SYRP: 5.0000 mL | ORAL_SOLUTION | Freq: Three times a day (TID) | ORAL | Status: DC | PRN

## 2015-08-04 MED ORDER — BENZONATATE 200 MG PO CAPS: 200.0000 mg | ORAL_CAPSULE | Freq: Two times a day (BID) | ORAL | Status: DC | PRN

## 2015-08-04 MED ORDER — AZITHROMYCIN 250 MG PO TABS: ORAL_TABLET | ORAL | Status: DC

## 2015-08-04 MED FILL — AZITHROMYCIN 250 MG TABLET: 250 | 5 days supply | Qty: 6 | Fill #0

## 2015-08-04 MED FILL — BENZONATATE 200 MG CAPSULE: 200 | 10 days supply | Qty: 20 | Fill #0

## 2015-08-04 NOTE — Progress Notes (Signed)
    Patient ID: Rolland BimlerCarly Mamaril MRN: [id number], DOB: Nov 24, 1980, 35 y.o. Date of Encounter: 08/04/2015, 9:47 AM    Chief Complaint:  Chief Complaint  Patient presents with  . sick x 1 week    sore throat, cough  cough getting worse     HPI: 35 y.o. year old white female presents with above. Says that her throat has been sore but now cough is getting worse. Has been going on over a week and getting worse. Having a lot of cough at night, interrupting her sleep. Minimal head/nasal congestion. No fevers or chills.     Home Meds:   Outpatient Prescriptions Prior to Visit  Medication Sig Dispense Refill  . MARLISSA 0.15-30 MG-MCG tablet   12   No facility-administered medications prior to visit.    Allergies: No Known Allergies    Review of Systems: See HPI for pertinent ROS. All other ROS negative.    Physical Exam: Blood pressure 104/72, pulse 96, temperature 99.3 F (37.4 C), temperature source Oral, resp. rate 18, weight 124 lb (56.246 kg), unknown if currently breastfeeding., Body mass index is 20.02 kg/(m^2). General:  WNWD WF. Appears in no acute distress. HEENT: Normocephalic, atraumatic, eyes without discharge, sclera non-icteric, nares are without discharge. Bilateral auditory canals clear, TM's are without perforation, pearly grey and translucent with reflective cone of light bilaterally. Oral cavity moist, posterior pharynx without exudate, erythema, peritonsillar abscess.  Neck: Supple. No thyromegaly. No lymphadenopathy. Lungs: Clear bilaterally to auscultation without wheezes, rales, or rhonchi. Breathing is unlabored. Heart: Regular rhythm. No murmurs, rubs, or gallops. Msk:  Strength and tone normal for age. Extremities/Skin: Warm and dry.  Neuro: Alert and oriented X 3. Moves all extremities spontaneously. Gait is normal. CNII-XII grossly in tact. Psych:  Responds to questions appropriately with a normal affect.   Results for orders placed or performed in visit  on 08/04/15  STREP GROUP A AG, W/REFLEX TO CULT  Result Value Ref Range   SOURCE THROAT    STREGTOCOCCUS GROUP A AG SCREEN Not Detected      ASSESSMENT AND PLAN:  35 y.o. year old female with  1. Bacterial respiratory infection She is to take antibiotic as directed. Can use the Tessalon especially at night so that she can get some sleep. Follow-up if symptoms do not resolve within 1 week after completion of antibiotic. - azithromycin (ZITHROMAX) 250 MG tablet; Day 1: Take 2 daily. Days 2-5: Take 1 daily.  Dispense: 6 tablet; Refill: 0 - benzonatate (TESSALON) 200 MG capsule; Take 1 capsule (200 mg total) by mouth 2 (two) times daily as needed for cough.  Dispense: 20 capsule; Refill: 0  2. Sorethroat - STREP GROUP A AG, W/REFLEX TO CULT   Signed, [address]Halina Asano Beth [place], [place], [facility] 08/04/2015 9:47 AM

## 2015-08-30 ENCOUNTER — Other Ambulatory Visit: Payer: 59

## 2015-09-02 ENCOUNTER — Encounter: Payer: 59 | Admitting: Family Medicine

## 2015-09-09 ENCOUNTER — Other Ambulatory Visit: Payer: 59

## 2015-09-09 DIAGNOSIS — Z79899 Other long term (current) drug therapy: Secondary | ICD-10-CM | POA: Diagnosis not present

## 2015-09-09 DIAGNOSIS — Z0000 Encounter for general adult medical examination without abnormal findings: Secondary | ICD-10-CM

## 2015-09-09 LAB — COMPLETE METABOLIC PANEL WITH GFR
ALT: 12 U/L (ref 6–29)
AST: 14 U/L (ref 10–30)
Albumin: 4.4 g/dL (ref 3.6–5.1)
Alkaline Phosphatase: 61 U/L (ref 33–115)
BUN: 13 mg/dL (ref 7–25)
CHLORIDE: 103 mmol/L (ref 98–110)
CO2: 27 mmol/L (ref 20–31)
Calcium: 9.5 mg/dL (ref 8.6–10.2)
Creat: 0.95 mg/dL (ref 0.50–1.10)
GFR, EST NON AFRICAN AMERICAN: 80 mL/min (ref >=60)
GFR, Est African American: >89 mL/min (ref >=60)
GLUCOSE: 85 mg/dL (ref 70–99)
POTASSIUM: 4.5 mmol/L (ref 3.5–5.3)
SODIUM: 140 mmol/L (ref 135–146)
Total Bilirubin: 0.6 mg/dL (ref 0.2–1.2)
Total Protein: 7.2 g/dL (ref 6.1–8.1)

## 2015-09-09 LAB — CBC WITH DIFFERENTIAL/PLATELET
BASOS PCT: 0 %
Basophils Absolute: 0 cells/uL (ref 0–200)
EOS PCT: 2 %
Eosinophils Absolute: 104 cells/uL (ref 15–500)
HEMATOCRIT: 48.4 % — AB (ref 35.0–45.0)
Hemoglobin: 16 g/dL — ABNORMAL HIGH (ref 12.0–15.0)
LYMPHS PCT: 38 %
Lymphs Abs: 1976 cells/uL (ref 850–3900)
MCH: 32.4 pg (ref 27.0–33.0)
MCHC: 33.1 g/dL (ref 32.0–36.0)
MCV: 98 fL (ref 80.0–100.0)
MONO ABS: 364 {cells}/uL (ref 200–950)
MONOS PCT: 7 %
MPV: 9.6 fL (ref 7.5–12.5)
NEUTROS PCT: 53 %
Neutro Abs: 2756 cells/uL (ref 1500–7800)
PLATELETS: 237 10*3/uL (ref 140–400)
RBC: 4.94 MIL/uL (ref 3.80–5.10)
RDW: 13 % (ref 11.0–15.0)
WBC: 5.2 10*3/uL (ref 3.8–10.8)

## 2015-09-09 LAB — TSH: TSH: 0.78 m[IU]/L

## 2015-09-09 LAB — LIPID PANEL
CHOL/HDL RATIO: 4.2 ratio (ref <=5.0)
Cholesterol: 237 mg/dL — ABNORMAL HIGH (ref 125–200)
HDL: 56 mg/dL (ref >=46)
LDL CALC: 160 mg/dL — AB (ref <130)
Triglycerides: 107 mg/dL (ref <150)
VLDL: 21 mg/dL (ref <30)

## 2015-09-12 ENCOUNTER — Ambulatory Visit (INDEPENDENT_AMBULATORY_CARE_PROVIDER_SITE_OTHER): Payer: 59 | Admitting: Family Medicine

## 2015-09-12 ENCOUNTER — Encounter: Payer: Self-pay | Admitting: Family Medicine

## 2015-09-12 VITALS — BP 120/62 | HR 80 | Temp 98.1°F | Resp 14 | Ht 65.5 in | Wt 126.0 lb

## 2015-09-12 DIAGNOSIS — E785 Hyperlipidemia, unspecified: Secondary | ICD-10-CM | POA: Diagnosis not present

## 2015-09-12 DIAGNOSIS — Z0000 Encounter for general adult medical examination without abnormal findings: Secondary | ICD-10-CM | POA: Diagnosis not present

## 2015-09-12 NOTE — Patient Instructions (Signed)
Return for fasting labs-cholesterol in 3 months F/U 1 year for Physical

## 2015-09-12 NOTE — Progress Notes (Signed)
Patient ID: Maria BimlerCarly Salinas, female   DOB: 1980-06-23, 35 y.o.   MRN: [id number]   Subjective:    Patient ID: Maria BimlerCarly Salinas, female    DOB: 1980-06-23, 35 y.o.   MRN: [id number]  Patient presents for CPE  Patient here for complete physical exam. She is no particular concerns. She was seen by her GYN back in February Pap smear was normal. She is family history of breast cancer her mother who passed away she's due for mammogram at age 35. Her immunizations are up-to-date. She's not had a significant problems with her allergies does not have any issues with her asthma since her teenage years. Her fasting labs to review. She does have notable hyperlipidemia with her LDL to 160 and total cholesterol in the 230s. She is trying to stay active but does not watch her diet very closely as she has always been thin  Seen by Dentist  Review Of Systems:  GEN- denies fatigue, fever, weight loss,weakness, recent illness HEENT- denies eye drainage, change in vision, nasal discharge, CVS- denies chest pain, palpitations RESP- denies SOB, cough, wheeze ABD- denies N/V, change in stools, abd pain GU- denies dysuria, hematuria, dribbling, incontinence MSK- denies joint pain, muscle aches, injury Neuro- denies headache, dizziness, syncope, seizure activity       Objective:    BP 120/62 mmHg  Pulse 80  Temp(Src) 98.1 F (36.7 C) (Oral)  Resp 14  Ht 5' 5.5" (1.664 m)  Wt 126 lb (57.153 kg)  BMI 20.64 kg/m2  LMP 09/07/2015 (Approximate) GEN- NAD, alert and oriented x3 HEENT- PERRL, EOMI, non injected sclera, pink conjunctiva, MMM, oropharynx clear Neck- Supple, no thyromegaly CVS- RRR, no murmur RESP-CTAB ABD-NABS,soft,NT,ND EXT- No edema Pulses- Radial, DP- 2+        Assessment & Plan:      Problem List Items Addressed This Visit    None    Visit Diagnoses    Routine general medical examination at a health care facility    -  Primary    CPE done, immunizations UTD, obtain PAP from GYN     Hyperlipidemia        Discussed dietary changes, will recheck in 3 months     Relevant Orders    Lipid panel       Note: This dictation was prepared with Dragon dictation along with smaller phrase technology. Any transcriptional errors that result from this process are unintentional.

## 2015-10-05 MED FILL — MARLISSA-28 TABLET: 0.15-30 | 84 days supply | Qty: 84 | Fill #1

## 2015-12-30 MED FILL — MARLISSA-28 TABLET: 0.15-30 | 84 days supply | Qty: 84 | Fill #2

## 2016-03-22 MED FILL — MARLISSA-28 TABLET: 0.15-30 | 84 days supply | Qty: 84 | Fill #3

## 2016-03-29 ENCOUNTER — Other Ambulatory Visit: Payer: 59

## 2016-03-29 DIAGNOSIS — E785 Hyperlipidemia, unspecified: Secondary | ICD-10-CM | POA: Diagnosis not present

## 2016-03-30 LAB — LIPID PANEL
CHOL/HDL RATIO: 3.8 ratio (ref <=5.0)
Cholesterol: 218 mg/dL — ABNORMAL HIGH (ref 125–200)
HDL: 57 mg/dL (ref >=46)
LDL CALC: 143 mg/dL — AB (ref <130)
TRIGLYCERIDES: 91 mg/dL (ref <150)
VLDL: 18 mg/dL (ref <30)

## 2016-05-31 MED FILL — MARLISSA-28 TABLET: 0.15-30 | 84 days supply | Qty: 84 | Fill #4

## 2016-07-04 DIAGNOSIS — Z6820 Body mass index (BMI) 20.0-20.9, adult: Secondary | ICD-10-CM | POA: Diagnosis not present

## 2016-07-04 DIAGNOSIS — Z01419 Encounter for gynecological examination (general) (routine) without abnormal findings: Secondary | ICD-10-CM | POA: Diagnosis not present

## 2016-09-04 MED FILL — MARLISSA-28 TABLET: 0.15-30 | 84 days supply | Qty: 84 | Fill #0

## 2016-11-29 MED FILL — LEVONOR-ETH ESTRAD 0.15-0.0: 0.15-30 | 84 days supply | Qty: 84 | Fill #1

## 2017-02-25 MED FILL — LEVONOR-ETH ESTRAD 0.15-0.0: 0.15-30 | 84 days supply | Qty: 84 | Fill #2

## 2017-03-05 ENCOUNTER — Other Ambulatory Visit: Payer: Self-pay | Admitting: Family Medicine

## 2017-03-05 DIAGNOSIS — Z0000 Encounter for general adult medical examination without abnormal findings: Secondary | ICD-10-CM

## 2017-03-06 ENCOUNTER — Other Ambulatory Visit: Payer: 59

## 2017-03-06 DIAGNOSIS — Z0000 Encounter for general adult medical examination without abnormal findings: Secondary | ICD-10-CM

## 2017-03-06 LAB — COMPLETE METABOLIC PANEL WITH GFR
AG Ratio: 1.7 (calc) (ref 1.0–2.5)
ALKALINE PHOSPHATASE (APISO): 66 U/L (ref 33–115)
ALT: 12 U/L (ref 6–29)
AST: 14 U/L (ref 10–30)
Albumin: 4.4 g/dL (ref 3.6–5.1)
BILIRUBIN TOTAL: 0.6 mg/dL (ref 0.2–1.2)
BUN / CREAT RATIO: 9 (calc) (ref 6–22)
BUN: 11 mg/dL (ref 7–25)
CHLORIDE: 105 mmol/L (ref 98–110)
CO2: 26 mmol/L (ref 20–32)
Calcium: 9.5 mg/dL (ref 8.6–10.2)
Creat: 1.16 mg/dL — ABNORMAL HIGH (ref 0.50–1.10)
GFR, Est African American: 71 mL/min/{1.73_m2} (ref >=60)
GFR, Est Non African American: 61 mL/min/{1.73_m2} (ref >=60)
GLUCOSE: 87 mg/dL (ref 65–99)
Globulin: 2.6 g/dL (calc) (ref 1.9–3.7)
POTASSIUM: 4.1 mmol/L (ref 3.5–5.3)
Sodium: 139 mmol/L (ref 135–146)
Total Protein: 7 g/dL (ref 6.1–8.1)

## 2017-03-06 LAB — CBC WITH DIFFERENTIAL/PLATELET
Basophils Absolute: 28 {cells}/uL (ref 0–200)
Basophils Relative: 0.5 %
Eosinophils Absolute: 99 {cells}/uL (ref 15–500)
Eosinophils Relative: 1.8 %
HCT: 46.2 % — ABNORMAL HIGH (ref 35.0–45.0)
Hemoglobin: 15.7 g/dL — ABNORMAL HIGH (ref 11.7–15.5)
Lymphs Abs: 2310 {cells}/uL (ref 850–3900)
MCH: 32.4 pg (ref 27.0–33.0)
MCHC: 34 g/dL (ref 32.0–36.0)
MCV: 95.5 fL (ref 80.0–100.0)
MPV: 10.1 fL (ref 7.5–12.5)
Monocytes Relative: 6.4 %
Neutro Abs: 2712 {cells}/uL (ref 1500–7800)
Neutrophils Relative %: 49.3 %
Platelets: 276 10*3/uL (ref 140–400)
RBC: 4.84 Million/uL (ref 3.80–5.10)
RDW: 11.8 % (ref 11.0–15.0)
Total Lymphocyte: 42 %
WBC mixed population: 352 {cells}/uL (ref 200–950)
WBC: 5.5 10*3/uL (ref 3.8–10.8)

## 2017-03-06 LAB — LIPID PANEL
Cholesterol: 209 mg/dL — ABNORMAL HIGH
HDL: 59 mg/dL
LDL Cholesterol (Calc): 131 mg/dL — ABNORMAL HIGH
Non-HDL Cholesterol (Calc): 150 mg/dL — ABNORMAL HIGH
Total CHOL/HDL Ratio: 3.5 (calc)
Triglycerides: 89 mg/dL

## 2017-03-11 ENCOUNTER — Ambulatory Visit (INDEPENDENT_AMBULATORY_CARE_PROVIDER_SITE_OTHER): Payer: 59 | Admitting: Family Medicine

## 2017-03-11 ENCOUNTER — Encounter: Payer: Self-pay | Admitting: Family Medicine

## 2017-03-11 VITALS — BP 112/70 | HR 93 | Temp 98.6°F | Resp 14 | Ht 65.5 in | Wt 120.0 lb

## 2017-03-11 DIAGNOSIS — L918 Other hypertrophic disorders of the skin: Secondary | ICD-10-CM | POA: Diagnosis not present

## 2017-03-11 DIAGNOSIS — Z23 Encounter for immunization: Secondary | ICD-10-CM | POA: Diagnosis not present

## 2017-03-11 DIAGNOSIS — E785 Hyperlipidemia, unspecified: Secondary | ICD-10-CM | POA: Diagnosis not present

## 2017-03-11 DIAGNOSIS — Z0000 Encounter for general adult medical examination without abnormal findings: Secondary | ICD-10-CM

## 2017-03-11 NOTE — Progress Notes (Signed)
   Subjective:    Patient ID: Teala Daffron, female    DOB: 09-Aug-1980, 36 y.o.   MRN: [id number]  Patient presents for Annual Exam (Pt NOT fasting) and Flu Vaccine Pt here for CPE no concerns, fasting labs reviewed [facility]- PAP Smear UTD  Flu shot due  Dentist follow up  Exercise regulary  On OCP  Review Of Systems:  GEN- denies fatigue, fever, weight loss,weakness, recent illness HEENT- denies eye drainage, change in vision, nasal discharge, CVS- denies chest pain, palpitations RESP- denies SOB, cough, wheeze ABD- denies N/V, change in stools, abd pain GU- denies dysuria, hematuria, dribbling, incontinence MSK- denies joint pain, muscle aches, injury Neuro- denies headache, dizziness, syncope, seizure activity       Objective:    BP 112/70   Pulse 93   Temp 98.6 F (37 C) (Oral)   Resp 14   Ht 5' 5.5" (1.664 m)   Wt 120 lb (54.4 kg)   SpO2 94%   BMI 19.67 kg/m  GEN- NAD, alert and oriented x3 HEENT- PERRL, EOMI, non injected sclera, pink conjunctiva, MMM, oropharynx clear Neck- Supple, no thyromegaly CVS- RRR, no murmur RESP-CTAB ABD-NABS,soft,NT,ND EXT- No edema Skin- multiple skin tags, axilla on bra line  Pulses- Radial, DP- 2+        Assessment & Plan:      Problem List Items Addressed This Visit      Unprioritized   Mild hyperlipidemia    Other Visit Diagnoses    Routine general medical examination at a health care facility    -  Primary   CPE done, cholesterol much improved, statin drug not needed, I think she may have some familial component as well to cholesterol. COntinue exercise, flu shot done He is always have borderline hemoglobin elevation at 15.7 now which is improved from 16 she's a nonsmo exposure to any toxins. She does not have any respiratory problems. Very remote history of asthma. Also creatinine looks okay she had been fasting for greater than 12 hours when she came in for her appointment    Cutaneous skin tags       Return for removal      Note: This dictation was prepared with Dragon dictation along with smaller phrase technology. Any transcriptional errors that result from this process are unintentional.

## 2017-03-11 NOTE — Patient Instructions (Addendum)
F/U 1 year  Flu shot done today  Schedule 30 minute slot for skin tag removal

## 2017-03-12 DIAGNOSIS — E785 Hyperlipidemia, unspecified: Secondary | ICD-10-CM | POA: Insufficient documentation

## 2017-03-14 NOTE — Addendum Note (Signed)
Addended by: Legrand RamsWILLIS, SANDY B on: 03/14/2017 08:48 AM   Modules accepted: Orders

## 2017-03-20 ENCOUNTER — Ambulatory Visit (INDEPENDENT_AMBULATORY_CARE_PROVIDER_SITE_OTHER): Payer: 59 | Admitting: Family Medicine

## 2017-03-20 ENCOUNTER — Encounter: Payer: Self-pay | Admitting: Family Medicine

## 2017-03-20 VITALS — BP 112/78 | HR 90 | Temp 98.9°F | Resp 14 | Ht 65.5 in | Wt 121.0 lb

## 2017-03-20 DIAGNOSIS — L918 Other hypertrophic disorders of the skin: Secondary | ICD-10-CM | POA: Diagnosis not present

## 2017-03-20 NOTE — Progress Notes (Signed)
   Subjective:    Patient ID: Maria Salinas, female    DOB: 06-Oct-1980, 36 y.o.   MRN: [id number]  Patient presents for Skin Tag Removal (back and axilla)   Pt here for skin tag removal She has multiple lesions along bra line on back Left axilla  would like removed,they get caught on clothing  We discussed at her CPE 10/15   Review Of Systems:  GEN- denies fatigue, fever, weight loss,weakness, recent illness        Objective:    BP 112/78   Pulse 90   Temp 98.9 F (37.2 C) (Oral)   Resp 14   Ht 5' 5.5" (1.664 m)   Wt 121 lb (54.9 kg)   SpO2 98%   BMI 19.83 kg/m  GEN- NAD, alert and oriented x3 Skin- multiple flesh toned in left axilla, 3 on back  along bra line   Procedure- Skin Tag Removal - 10 skin tags  Procedure explained to patient questions answered benefits and risks discussed verbal consent obtained. Antiseptic- betadine Anesthesia-Cold spray Tags clipped at base with scissors Minimal blood loss, drysol  touched to lesion on neck and axilla due to persistent oozing Patient tolerated procedure well Bandage applied         Assessment & Plan:      Problem List Items Addressed This Visit    None    Visit Diagnoses    Skin tags, multiple acquired    -  Primary   Benign lesions were removed discussed aftercare and signs for infection      Note: This dictation was prepared with Dragon dictation along with smaller phrase technology. Any transcriptional errors that result from this process are unintentional.

## 2017-03-20 NOTE — Patient Instructions (Signed)
Cover with vaseline  bandaid until healed F/U as needed

## 2017-11-13 LAB — OB RESULTS CONSOLE ABO/RH: RH Type: POSITIVE

## 2017-11-13 LAB — OB RESULTS CONSOLE ANTIBODY SCREEN: Antibody Screen: NEGATIVE

## 2017-11-13 LAB — OB RESULTS CONSOLE HIV ANTIBODY (ROUTINE TESTING): HIV: NONREACTIVE

## 2017-11-13 LAB — OB RESULTS CONSOLE RPR: RPR: NONREACTIVE

## 2017-11-13 LAB — OB RESULTS CONSOLE HEPATITIS B SURFACE ANTIGEN: Hepatitis B Surface Ag: NEGATIVE

## 2017-11-13 LAB — OB RESULTS CONSOLE GC/CHLAMYDIA
Chlamydia: NEGATIVE
Gonorrhea: NEGATIVE

## 2017-11-13 LAB — OB RESULTS CONSOLE RUBELLA ANTIBODY, IGM: RUBELLA: IMMUNE

## 2018-04-30 MED FILL — AZITHROMYCIN 250 MG TABLET: 250 | 5 days supply | Qty: 6 | Fill #0

## 2018-05-28 NOTE — L&D Delivery Note (Signed)
Delivery Note At 4:57 PM a viable female was delivered via NSVD (Presentation: ; vertex ).  APGAR: pending, weight pending placenta WNL  Cord:  with the following complications: .  Cord pH: not sent  Anesthesia:  CLE Episiotomy:  None Lacerations:  None Suture Repair: None Est. Blood Loss (mL) 200cc:     It's a girl " Maria Salinas"!!   Mom to postpartum.  Baby to Couplet care / Skin to Skin.  Ranae Pilalise Jennifer Nirali Magouirk 06/16/2018, 5:10 PM

## 2018-05-31 LAB — OB RESULTS CONSOLE GBS: GBS: POSITIVE

## 2018-06-12 ENCOUNTER — Telehealth (HOSPITAL_COMMUNITY): Payer: Self-pay | Admitting: *Deleted

## 2018-06-12 ENCOUNTER — Encounter (HOSPITAL_COMMUNITY): Payer: Self-pay | Admitting: *Deleted

## 2018-06-12 NOTE — Telephone Encounter (Signed)
Preadmission screen  

## 2018-06-13 NOTE — H&P (Signed)
Maria Salinas is a 38 y.o. female presenting for elective IOL w/favorable cervix. NSVDx2. GBS pos. SURPRISE GENDER. OB History    Gravida  3   Para  2   Term  2   Preterm  0   AB  0   Living  2     SAB  0   TAB  0   Ectopic  0   Multiple  0   Live Births  1          Past Medical History:  Diagnosis Date  . Anxiety   . Asthma   . HPV (human papilloma virus) anogenital infection   . No pertinent past medical history   . Vaginal Pap smear, abnormal    Past Surgical History:  Procedure Laterality Date  . NO PAST SURGERIES     Family History: family history includes Alcohol abuse in her maternal grandfather and paternal grandfather; Asthma in her paternal grandmother; Cancer (age of onset: 48) in her mother; Heart disease in her maternal grandfather, paternal grandfather, and paternal grandmother; Hyperlipidemia in her father. Social History:  reports that she has never smoked. She has never used smokeless tobacco. She reports current alcohol use of about 1.0 standard drinks of alcohol per week. She reports that she does not use drugs.     Maternal Diabetes: No Genetic Screening: Normal Maternal Ultrasounds/Referrals: Normal Fetal Ultrasounds or other Referrals:  None Maternal Substance Abuse:  No Significant Maternal Medications:  None Significant Maternal Lab Results:  None Other Comments:  None  ROS History   unknown if currently breastfeeding. Exam Physical Exam  Prenatal labs: ABO, Rh: B/Positive/-- (06/19 0000) Antibody: Negative (06/19 0000) Rubella: Immune (06/19 0000) RPR: Nonreactive (06/19 0000)  HBsAg: Negative (06/19 0000)  HIV: Non-reactive (06/19 0000)  GBS: Positive (01/04 0000)   Assessment/Plan: 38 yo G3P2002 @ 38 wga presenting for eIOL.  Favorable cervix - 3cm Pitocin/AROM NSVDx2 Panorama WNL - SURPRISE GENDER GBS pos - plan PCN per protocol   Ranae Pila 06/13/2018, 1:33 PM

## 2018-06-16 ENCOUNTER — Inpatient Hospital Stay (HOSPITAL_COMMUNITY)
Admission: RE | Admit: 2018-06-16 | Discharge: 2018-06-17 | DRG: 807 | Disposition: A | Discharge status: Home / Self Care | Payer: No Typology Code available for payment source | Attending: Obstetrics and Gynecology | Admitting: Obstetrics and Gynecology

## 2018-06-16 ENCOUNTER — Encounter (HOSPITAL_COMMUNITY): Payer: Self-pay

## 2018-06-16 ENCOUNTER — Inpatient Hospital Stay (HOSPITAL_COMMUNITY): Payer: No Typology Code available for payment source | Admitting: Anesthesiology

## 2018-06-16 VITALS — BP 105/77 | HR 70 | Temp 98.3°F | Resp 20 | Ht 64.0 in | Wt 139.8 lb

## 2018-06-16 DIAGNOSIS — Z3A39 39 weeks gestation of pregnancy: Secondary | ICD-10-CM | POA: Diagnosis not present

## 2018-06-16 DIAGNOSIS — O26893 Other specified pregnancy related conditions, third trimester: Secondary | ICD-10-CM | POA: Diagnosis present

## 2018-06-16 DIAGNOSIS — Z3490 Encounter for supervision of normal pregnancy, unspecified, unspecified trimester: Secondary | ICD-10-CM

## 2018-06-16 DIAGNOSIS — O99824 Streptococcus B carrier state complicating childbirth: Principal | ICD-10-CM | POA: Diagnosis present

## 2018-06-16 LAB — ABO/RH: ABO/RH(D): B POS

## 2018-06-16 LAB — CBC
HCT: 39.8 % (ref 36.0–46.0)
Hemoglobin: 12.9 g/dL (ref 12.0–15.0)
MCH: 31.5 pg (ref 26.0–34.0)
MCHC: 32.4 g/dL (ref 30.0–36.0)
MCV: 97.1 fL (ref 80.0–100.0)
PLATELETS: 197 10*3/uL (ref 150–400)
RBC: 4.1 MIL/uL (ref 3.87–5.11)
RDW: 13.2 % (ref 11.5–15.5)
WBC: 9.7 10*3/uL (ref 4.0–10.5)
nRBC: 0 % (ref 0.0–0.2)

## 2018-06-16 LAB — TYPE AND SCREEN
ABO/RH(D): B POS
ANTIBODY SCREEN: NEGATIVE

## 2018-06-16 LAB — RPR: RPR Ser Ql: NONREACTIVE

## 2018-06-16 MED ORDER — SODIUM CHLORIDE 0.9 % IV SOLN
5.0000 10*6.[IU] | Freq: Once | INTRAVENOUS | Status: AC
  Administered 2018-06-16: 5 10*6.[IU] via INTRAVENOUS
  Filled 2018-06-16: qty 5

## 2018-06-16 MED ORDER — ACETAMINOPHEN 325 MG PO TABS: 650.0000 mg | ORAL_TABLET | ORAL | Status: DC | PRN

## 2018-06-16 MED ORDER — PHENYLEPHRINE 40 MCG/ML (10ML) SYRINGE FOR IV PUSH (FOR BLOOD PRESSURE SUPPORT)
80.0000 ug | PREFILLED_SYRINGE | INTRAVENOUS | Status: DC | PRN
  Filled 2018-06-16 (×2): qty 10

## 2018-06-16 MED ORDER — ACETAMINOPHEN 325 MG PO TABS
650.0000 mg | ORAL_TABLET | ORAL | Status: DC | PRN
  Administered 2018-06-17: 650 mg via ORAL
  Filled 2018-06-16: qty 2

## 2018-06-16 MED ORDER — OXYTOCIN 40 UNITS IN NORMAL SALINE INFUSION - SIMPLE MED
1.0000 m[IU]/min | INTRAVENOUS | Status: DC
  Administered 2018-06-16: 2 m[IU]/min via INTRAVENOUS
  Filled 2018-06-16: qty 1000

## 2018-06-16 MED ORDER — EPHEDRINE 5 MG/ML INJ
10.0000 mg | INTRAVENOUS | Status: DC | PRN
  Filled 2018-06-16: qty 2

## 2018-06-16 MED ORDER — LIDOCAINE HCL (PF) 1 % IJ SOLN
INTRAMUSCULAR | Status: DC | PRN
  Administered 2018-06-16 (×2): 5 mL via EPIDURAL

## 2018-06-16 MED ORDER — LACTATED RINGERS IV SOLN: 500.0000 mL | Freq: Once | INTRAVENOUS | Status: DC

## 2018-06-16 MED ORDER — LACTATED RINGERS IV SOLN
INTRAVENOUS | Status: DC
  Administered 2018-06-16: 125 mL/h via INTRAVENOUS

## 2018-06-16 MED ORDER — OXYCODONE HCL 5 MG PO TABS: 5.0000 mg | ORAL_TABLET | ORAL | Status: DC | PRN

## 2018-06-16 MED ORDER — LACTATED RINGERS IV SOLN: 500.0000 mL | INTRAVENOUS | Status: DC | PRN

## 2018-06-16 MED ORDER — ONDANSETRON HCL 4 MG PO TABS: 4.0000 mg | ORAL_TABLET | ORAL | Status: DC | PRN

## 2018-06-16 MED ORDER — PRENATAL MULTIVITAMIN CH
1.0000 | ORAL_TABLET | Freq: Every day | ORAL | Status: DC
  Administered 2018-06-17: 1 via ORAL
  Filled 2018-06-16: qty 1

## 2018-06-16 MED ORDER — ONDANSETRON HCL 4 MG/2ML IJ SOLN: 4.0000 mg | Freq: Four times a day (QID) | INTRAMUSCULAR | Status: DC | PRN

## 2018-06-16 MED ORDER — DIBUCAINE 1 % RE OINT: 1.0000 "application " | TOPICAL_OINTMENT | RECTAL | Status: DC | PRN

## 2018-06-16 MED ORDER — OXYCODONE HCL 5 MG PO TABS: 10.0000 mg | ORAL_TABLET | ORAL | Status: DC | PRN

## 2018-06-16 MED ORDER — ZOLPIDEM TARTRATE 5 MG PO TABS: 5.0000 mg | ORAL_TABLET | Freq: Every evening | ORAL | Status: DC | PRN

## 2018-06-16 MED ORDER — SOD CITRATE-CITRIC ACID 500-334 MG/5ML PO SOLN: 30.0000 mL | ORAL | Status: DC | PRN

## 2018-06-16 MED ORDER — LIDOCAINE HCL (PF) 1 % IJ SOLN
30.0000 mL | INTRAMUSCULAR | Status: DC | PRN
  Filled 2018-06-16: qty 30

## 2018-06-16 MED ORDER — TETANUS-DIPHTH-ACELL PERTUSSIS 5-2.5-18.5 LF-MCG/0.5 IM SUSP: 0.5000 mL | Freq: Once | INTRAMUSCULAR | Status: DC

## 2018-06-16 MED ORDER — SIMETHICONE 80 MG PO CHEW: 80.0000 mg | CHEWABLE_TABLET | ORAL | Status: DC | PRN

## 2018-06-16 MED ORDER — DIPHENHYDRAMINE HCL 50 MG/ML IJ SOLN: 12.5000 mg | INTRAMUSCULAR | Status: DC | PRN

## 2018-06-16 MED ORDER — SENNOSIDES-DOCUSATE SODIUM 8.6-50 MG PO TABS
2.0000 | ORAL_TABLET | ORAL | Status: DC
  Administered 2018-06-17: 2 via ORAL
  Filled 2018-06-16: qty 2

## 2018-06-16 MED ORDER — COCONUT OIL OIL: 1.0000 "application " | TOPICAL_OIL | Status: DC | PRN

## 2018-06-16 MED ORDER — ONDANSETRON HCL 4 MG/2ML IJ SOLN: 4.0000 mg | INTRAMUSCULAR | Status: DC | PRN

## 2018-06-16 MED ORDER — IBUPROFEN 600 MG PO TABS
600.0000 mg | ORAL_TABLET | Freq: Four times a day (QID) | ORAL | Status: DC
  Administered 2018-06-16 – 2018-06-17 (×5): 600 mg via ORAL
  Filled 2018-06-16 (×5): qty 1

## 2018-06-16 MED ORDER — OXYTOCIN 40 UNITS IN NORMAL SALINE INFUSION - SIMPLE MED: 2.5000 [IU]/h | INTRAVENOUS | Status: DC

## 2018-06-16 MED ORDER — WITCH HAZEL-GLYCERIN EX PADS: 1.0000 "application " | MEDICATED_PAD | CUTANEOUS | Status: DC | PRN

## 2018-06-16 MED ORDER — FENTANYL 2.5 MCG/ML BUPIVACAINE 1/10 % EPIDURAL INFUSION (WH - ANES)
14.0000 mL/h | INTRAMUSCULAR | Status: DC | PRN
  Administered 2018-06-16: 14 mL/h via EPIDURAL
  Filled 2018-06-16: qty 100

## 2018-06-16 MED ORDER — BUTORPHANOL TARTRATE 1 MG/ML IJ SOLN: 1.0000 mg | INTRAMUSCULAR | Status: DC | PRN

## 2018-06-16 MED ORDER — PHENYLEPHRINE 40 MCG/ML (10ML) SYRINGE FOR IV PUSH (FOR BLOOD PRESSURE SUPPORT)
80.0000 ug | PREFILLED_SYRINGE | INTRAVENOUS | Status: DC | PRN
  Filled 2018-06-16: qty 10

## 2018-06-16 MED ORDER — OXYCODONE-ACETAMINOPHEN 5-325 MG PO TABS: 1.0000 | ORAL_TABLET | ORAL | Status: DC | PRN

## 2018-06-16 MED ORDER — OXYTOCIN BOLUS FROM INFUSION: 500.0000 mL | Freq: Once | INTRAVENOUS | Status: DC

## 2018-06-16 MED ORDER — DIPHENHYDRAMINE HCL 25 MG PO CAPS: 25.0000 mg | ORAL_CAPSULE | Freq: Four times a day (QID) | ORAL | Status: DC | PRN

## 2018-06-16 MED ORDER — OXYCODONE-ACETAMINOPHEN 5-325 MG PO TABS: 2.0000 | ORAL_TABLET | ORAL | Status: DC | PRN

## 2018-06-16 MED ORDER — BENZOCAINE-MENTHOL 20-0.5 % EX AERO
1.0000 "application " | INHALATION_SPRAY | CUTANEOUS | Status: DC | PRN
  Administered 2018-06-16: 1 via TOPICAL
  Filled 2018-06-16: qty 56

## 2018-06-16 MED ORDER — PENICILLIN G 3 MILLION UNITS IVPB - SIMPLE MED
3.0000 10*6.[IU] | INTRAVENOUS | Status: DC
  Administered 2018-06-16: 3 10*6.[IU] via INTRAVENOUS
  Filled 2018-06-16 (×3): qty 100

## 2018-06-16 MED ORDER — TERBUTALINE SULFATE 1 MG/ML IJ SOLN
0.2500 mg | Freq: Once | INTRAMUSCULAR | Status: DC | PRN
  Filled 2018-06-16: qty 1

## 2018-06-16 NOTE — Anesthesia Procedure Notes (Signed)
Epidural Patient location during procedure: OB Start time: 06/16/2018 1:32 PM End time: 06/16/2018 1:45 PM  Staffing Anesthesiologist: Lucretia Kern, MD Performed: anesthesiologist   Preanesthetic Checklist Completed: patient identified, pre-op evaluation, timeout performed, IV checked, risks and benefits discussed and monitors and equipment checked  Epidural Patient position: sitting Prep: DuraPrep Patient monitoring: heart rate, continuous pulse ox and blood pressure Approach: midline Location: L3-L4 Injection technique: LOR air  Needle:  Needle type: Tuohy  Needle gauge: 17 G Needle length: 9 cm Needle insertion depth: 5 cm Catheter type: closed end flexible Catheter size: 19 Gauge Catheter at skin depth: 10 cm  Assessment Events: blood not aspirated, injection not painful, no injection resistance, negative IV test and no paresthesia  Additional Notes Reason for block:procedure for pain

## 2018-06-16 NOTE — Anesthesia Preprocedure Evaluation (Signed)

## 2018-06-16 NOTE — Progress Notes (Signed)
6/50/-2 CLE now

## 2018-06-16 NOTE — Progress Notes (Signed)
S: No c/o  O: AFVSS SVE 3/50/-2 AROM clear fluid  A/P: 38 yo G3P2002 @ 39.1 wga presenting for eIOL w/favorable cervix. Pitocin started. AROM for clear fluid >1 hour after PCN initiated.  Panorama WNL - SURPRISE GENDER Cont PCN for GBS prophylaxis   Rosie Fate MD

## 2018-06-16 NOTE — Anesthesia Pain Management Evaluation Note (Signed)
  CRNA Pain Management Visit Note  Patient: Maria Salinas, 38 y.o., female  "Hello I am a member of the anesthesia team at [facility]. We have an anesthesia team available at all times to provide care throughout the hospital, including epidural management and anesthesia for C-section. I don't know your plan for the delivery whether it a natural birth, water birth, IV sedation, nitrous supplementation, doula or epidural, but we want to meet your pain goals."   1.Was your pain managed to your expectations on prior hospitalizations?   Yes   2.What is your expectation for pain management during this hospitalization?     Epidural and IV pain meds  3.How can we help you reach that goal? Be available  Record the patient's initial score and the patient's pain goal.   Pain: 4  Pain Goal: 6 [facility] wants you to be able to say your pain was always managed very well.  [facility] 06/16/2018

## 2018-06-17 LAB — CBC
HCT: 39.2 % (ref 36.0–46.0)
HEMOGLOBIN: 12.4 g/dL (ref 12.0–15.0)
MCH: 30.5 pg (ref 26.0–34.0)
MCHC: 31.6 g/dL (ref 30.0–36.0)
MCV: 96.6 fL (ref 80.0–100.0)
Platelets: 184 10*3/uL (ref 150–400)
RBC: 4.06 MIL/uL (ref 3.87–5.11)
RDW: 13.2 % (ref 11.5–15.5)
WBC: 14.2 10*3/uL — AB (ref 4.0–10.5)
nRBC: 0 % (ref 0.0–0.2)

## 2018-06-17 NOTE — Discharge Summary (Signed)
Obstetric Discharge Summary Reason for Admission: induction of labor Prenatal Procedures: none Intrapartum Procedures: spontaneous vaginal delivery Postpartum Procedures: none Complications-Operative and Postpartum: none Hemoglobin  Date Value Ref Range Status  06/17/2018 12.4 12.0 - 15.0 g/dL Final   HCT  Date Value Ref Range Status  06/17/2018 39.2 36.0 - 46.0 % Final    Physical Exam:  General: alert, cooperative and appears stated age 68Lochia: appropriate Uterine Fundus: firm Incision: healing well, no significant drainage, no dehiscence DVT Evaluation: No evidence of DVT seen on physical exam.  Discharge Diagnoses: Term Pregnancy-delivered  Discharge Information: Date: 06/17/2018 Activity: pelvic rest Diet: routine Medications: None Condition: improved Instructions: refer to practice specific booklet Discharge to: home   Newborn Data: Live born female  Birth Weight: 8 lb 2.2 oz (3690 g) APGAR: 9, 9  Newborn Delivery   Birth date/time:  06/16/2018 16:57:00 Delivery type:  Vaginal, Spontaneous     Home with mother.  Jeani HawkingMichelle L Melanye Hiraldo 06/17/2018, 8:37 AM

## 2018-06-17 NOTE — Progress Notes (Signed)
MOB was referred for history of anxiety. * Referral screened out by Clinical Social Worker because none of the following criteria appear to apply: ~ History of anxiety/depression during this pregnancy, or of post-partum depression following prior delivery. Per OB records review, anxiety not noted in OB records.  ~ Diagnosis of anxiety and/or depression within last 3 years. Per chart review, patient was seen by MD on 08/23/2014 and reported that she was "treated for anxiety 15 years ago when mother passed away from breast cancer".  OR * MOB's symptoms currently being treated with medication and/or therapy. Please contact the Clinical Social Worker if needs arise, by [facility] request, or if MOB scores greater than 9/yes to question 10 on Edinburgh Postpartum Depression Screen.  Celso Sickle, LCSWA Clinical Social Worker [facility] [place] Cell#: [phone]

## 2018-06-17 NOTE — Lactation Note (Signed)
This note was copied from a baby's chart. Lactation Consultation Note  Patient Name: Maria Salinas [id number] Date: 06/17/2018   Employee pump given to Mom.  Mom aware of OP lactation support available and encouraged to call prn. No difficulties voiced.    Maria Salinas 06/17/2018, 3:54 PM

## 2018-06-17 NOTE — Lactation Note (Signed)
This note was copied from a baby's chart. Lactation Consultation Note  Patient Name: Maria Salinas [id number] Date: 06/17/2018 Reason for consult: Initial assessment;Term P3, 9 hour female infant. Per mom, she feels breastfeeding is going well. Per mom, she breastfeed her 1st child for 14 months and 2nd child for 18 months. LC did not observe a latch at this time, per mom, she breastfeed infant at 1:30 am Mom doesn't have a  breast pump at home, [facility] gave [place] hand pump and explained how to use. Dad is a Runner, broadcasting/film/video with [employer]. LC discussed I & O. Reviewed Baby & Me book's Breastfeeding Basics.  Mom knows to call Nurse or LC if she has any questions, concerns or  needs assistance with latching infant to breast.  Mom made aware of O/P services, breastfeeding support groups, community resources, and our phone # for post-discharge questions.   Maternal Data Formula Feeding for Exclusion: No Has patient been taught Hand Expression?: Yes Does the patient have breastfeeding experience prior to this delivery?: Yes  Feeding Feeding Type: Breast Fed  LATCH Score                   Interventions Interventions: Breast feeding basics reviewed;Hand pump  Lactation Tools Discussed/Used WIC Program: No Pump Review: Setup, frequency, and cleaning;Milk Storage Initiated by:: Danelle Earthly, IBCLC Date initiated:: 06/17/18   Consult Status      Merion Caton 06/17/2018, 2:56 AM

## 2018-06-17 NOTE — Anesthesia Postprocedure Evaluation (Signed)
Anesthesia Post Note  Patient: Retail banker  Procedure(s) Performed: AN AD HOC LABOR EPIDURAL     Patient location during evaluation: Mother Baby Anesthesia Type: Epidural Level of consciousness: awake and alert and oriented Pain management: satisfactory to patient Vital Signs Assessment: post-procedure vital signs reviewed and stable Respiratory status: respiratory function stable Cardiovascular status: stable Postop Assessment: no headache, no backache, epidural receding, patient able to bend at knees, no signs of nausea or vomiting and adequate PO intake Anesthetic complications: no    Last Vitals:  Vitals:   06/17/18 0425 06/17/18 0850  BP: 122/68 105/77  Pulse: 62 70  Resp: 16 20  Temp: 36.7 C 36.8 C  SpO2: 98% 98%    Last Pain:  Vitals:   06/17/18 0850  TempSrc: Oral  PainSc: 0-No pain   Pain Goal:                Epidural/Spinal Function Cutaneous sensation: Normal sensation (06/17/18 0850), Patient able to flex knees: Yes (06/17/18 0850), Patient able to lift hips off bed: Yes (06/17/18 0850), Back pain beyond tenderness at insertion site: No (06/17/18 0850), Progressively worsening motor and/or sensory loss: No (06/17/18 0850)  Karleen Dolphin

## 2018-06-22 ENCOUNTER — Inpatient Hospital Stay (HOSPITAL_COMMUNITY): Admit: 2018-06-22 | Payer: Self-pay

## 2018-08-15 MED FILL — NORETHINDRONE 0.35 MG TAB: 0.35 | 84 days supply | Qty: 84 | Fill #0

## 2018-11-07 MED FILL — NORETHINDRONE 0.35 MG TAB: 0.35 | 84 days supply | Qty: 84 | Fill #1

## 2019-01-30 MED FILL — NORETHINDRONE 0.35 MG TABS: 0.35 | 84 days supply | Qty: 84 | Fill #2

## 2019-03-30 ENCOUNTER — Other Ambulatory Visit: Payer: Self-pay

## 2019-03-31 ENCOUNTER — Other Ambulatory Visit: Payer: No Typology Code available for payment source

## 2019-03-31 DIAGNOSIS — Z0000 Encounter for general adult medical examination without abnormal findings: Secondary | ICD-10-CM

## 2019-04-01 LAB — CBC WITH DIFFERENTIAL/PLATELET
Absolute Monocytes: 429 cells/uL (ref 200–950)
Basophils Absolute: 32 cells/uL (ref 0–200)
Basophils Relative: 0.6 %
Eosinophils Absolute: 111 cells/uL (ref 15–500)
Eosinophils Relative: 2.1 %
HCT: 47.5 % — ABNORMAL HIGH (ref 35.0–45.0)
Hemoglobin: 15.8 g/dL — ABNORMAL HIGH (ref 11.7–15.5)
Lymphs Abs: 2109 cells/uL (ref 850–3900)
MCH: 32 pg (ref 27.0–33.0)
MCHC: 33.3 g/dL (ref 32.0–36.0)
MCV: 96.3 fL (ref 80.0–100.0)
MPV: 10.2 fL (ref 7.5–12.5)
Monocytes Relative: 8.1 %
Neutro Abs: 2618 cells/uL (ref 1500–7800)
Neutrophils Relative %: 49.4 %
Platelets: 221 10*3/uL (ref 140–400)
RBC: 4.93 10*6/uL (ref 3.80–5.10)
RDW: 11.8 % (ref 11.0–15.0)
Total Lymphocyte: 39.8 %
WBC: 5.3 10*3/uL (ref 3.8–10.8)

## 2019-04-01 LAB — COMPREHENSIVE METABOLIC PANEL
AG Ratio: 1.9 (calc) (ref 1.0–2.5)
ALT: 12 U/L (ref 6–29)
AST: 13 U/L (ref 10–30)
Albumin: 4.5 g/dL (ref 3.6–5.1)
Alkaline phosphatase (APISO): 106 U/L (ref 31–125)
BUN: 17 mg/dL (ref 7–25)
CO2: 27 mmol/L (ref 20–32)
Calcium: 9.7 mg/dL (ref 8.6–10.2)
Chloride: 106 mmol/L (ref 98–110)
Creat: 1.02 mg/dL (ref 0.50–1.10)
Globulin: 2.4 g/dL (calc) (ref 1.9–3.7)
Glucose, Bld: 81 mg/dL (ref 65–99)
Potassium: 4.8 mmol/L (ref 3.5–5.3)
Sodium: 142 mmol/L (ref 135–146)
Total Bilirubin: 0.8 mg/dL (ref 0.2–1.2)
Total Protein: 6.9 g/dL (ref 6.1–8.1)

## 2019-04-01 LAB — LIPID PANEL
Cholesterol: 182 mg/dL (ref <200)
HDL: 56 mg/dL (ref >=50)
LDL Cholesterol (Calc): 112 mg/dL (calc) — ABNORMAL HIGH
Non-HDL Cholesterol (Calc): 126 mg/dL (calc) (ref <130)
Total CHOL/HDL Ratio: 3.3 (calc) (ref <5.0)
Triglycerides: 47 mg/dL (ref <150)

## 2019-04-07 ENCOUNTER — Encounter: Payer: Self-pay | Admitting: Family Medicine

## 2019-04-07 ENCOUNTER — Other Ambulatory Visit: Payer: Self-pay

## 2019-04-07 ENCOUNTER — Ambulatory Visit (INDEPENDENT_AMBULATORY_CARE_PROVIDER_SITE_OTHER): Payer: No Typology Code available for payment source | Admitting: Family Medicine

## 2019-04-07 VITALS — BP 110/68 | HR 99 | Temp 98.4°F | Resp 16 | Ht 64.0 in | Wt 114.0 lb

## 2019-04-07 DIAGNOSIS — Z0000 Encounter for general adult medical examination without abnormal findings: Secondary | ICD-10-CM

## 2019-04-07 DIAGNOSIS — Z0001 Encounter for general adult medical examination with abnormal findings: Secondary | ICD-10-CM | POA: Diagnosis not present

## 2019-04-07 DIAGNOSIS — Z23 Encounter for immunization: Secondary | ICD-10-CM

## 2019-04-07 DIAGNOSIS — M542 Cervicalgia: Secondary | ICD-10-CM | POA: Diagnosis not present

## 2019-04-07 NOTE — Progress Notes (Signed)
Subjective:    Patient ID: Maria Salinas, female    DOB: 1981/02/28, 38 y.o.   MRN: [id number]  Patient presents for Annual Exam and Flu Vaccine Patient here for complete physical exam. She has a 2-month-old at home since our last visit.   Immunizations she is due for flu shot/ TDAP UTD 2019   She did have recent fasting labs I reviewed at the bedside.  She has history of mild hyperlipidemia her cholesterol had improved LDL was down to 112 Metabolic panel was normal.  CBC fairly unremarkable  July  Started with right sided neck discomfort into upper back.  She initially thought she had slept wrong.  But she does note that she carries her infant on her right side all the time.  When she moves her neck toward the right side she will feel discomfort like a pulling sensation.  She denies any spasms in her back or neck.  Denies any tingling or numbness in the fingertips.  She did have headache that lasted a week a few weeks ago she ended taking Tylenol and that resolved the headache.  Currently on Micronor ,Breastfeeding  Family history of breast cancer Mother  -  -March has annual GYN with PAP Smear, will have genetic testing done for family history of breast cancer, planning for imaging 6 months after she stops breafstfeeding    Follows with dentist every 6 months  Will f/u with eye doctor       Review Of Systems:  GEN- denies fatigue, fever, weight loss,weakness, recent illness HEENT- denies eye drainage, change in vision, nasal discharge, CVS- denies chest pain, palpitations RESP- denies SOB, cough, wheeze ABD- denies N/V, change in stools, abd pain GU- denies dysuria, hematuria, dribbling, incontinence MSK- denies joint pain, muscle aches, injury Neuro- denies headache, dizziness, syncope, seizure activity       Objective:    BP 110/68   Pulse 99   Temp 98.4 F (36.9 C) (Other (Comment))   Resp 16   Ht 5\' 4"  (1.626 m)   Wt 114 lb (51.7 kg)   SpO2 98%   BMI 19.57 kg/m   GEN- NAD, alert and oriented x3 HEENT- PERRL, EOMI, non injected sclera, pink conjunctiva, MMM, oropharynx clear Neck- Supple, no thyromegaly,  MSK- Spine NT, C spine NT, no spasm, mild TTP over right paraspinals over to shoulder blade and across right side trapezius region. FROM neck FROM upper ext bialt, rotator cuff in tact  CVS- RRR, no murmur RESP-CTAB ABD-NABS,soft,NT,ND NEURO-CNII-XII in tact, no focal deficits  EXT- No edema Pulses- Radial, DP- 2+   PHQ9 score 0      Assessment & Plan:      Problem List Items Addressed This Visit    None    Visit Diagnoses    Routine general medical examination at a health care facility    -  Primary   CPE done, cholesterol improved with dietary changes, exercise, Flu shot given, F/U GYN for PAP She will contact her GYN when she is ready to start weaning to switch over to a combination estrogen progesterone pill.  If there is a delay then I am willing to prescribe this for her until she gets to her GYN.  For now she will continue her Micronor   Neck pain       MSK pain, no red flags, can use topical rubs, okay to try chiropracter For some reason her symptoms do not improve with conservative methods or she gets radicular symptoms  I would proceed with imaging   Needs flu shot       Relevant Orders   Flu Vaccine QUAD 36+ mos PF IM (Fluarix) (Completed)      Note: This dictation was prepared with Dragon dictation along with smaller phrase technology. Any transcriptional errors that result from this process are unintentional.

## 2019-04-07 NOTE — Patient Instructions (Signed)
F/U 1 year for physical  Try the topicals- Biofreeze, Voltaren gel

## 2019-04-22 MED FILL — NORETHINDRONE 0.35 MG TABS: 0.35 | 84 days supply | Qty: 84 | Fill #3

## 2019-07-20 MED FILL — NORETHINDRONE 0.35 MG TABS: 0.35 | 84 days supply | Qty: 84 | Fill #4

## 2019-07-30 ENCOUNTER — Other Ambulatory Visit (HOSPITAL_COMMUNITY): Payer: Self-pay | Admitting: Obstetrics and Gynecology

## 2019-07-30 MED FILL — LEVONOR-ETH ESTRAD 0.15-0.0: 0.15-30 | 84 days supply | Qty: 84 | Fill #0

## 2019-08-14 ENCOUNTER — Ambulatory Visit: Payer: No Typology Code available for payment source | Attending: Internal Medicine

## 2019-08-14 DIAGNOSIS — Z23 Encounter for immunization: Secondary | ICD-10-CM

## 2019-08-14 NOTE — Progress Notes (Signed)
   Covid-19 Vaccination Clinic  Name:  Shawan Tosh    MRN: [id number] DOB: 1980/11/02  08/14/2019  Ms. Avellino was observed post Covid-19 immunization for 15 minutes without incident. She was provided with Vaccine Information Sheet and instruction to access the V-Safe system.   Ms. Seifried was instructed to call 911 with any severe reactions post vaccine: Marland Kitchen Difficulty breathing  . Swelling of face and throat  . A fast heartbeat  . A bad rash all over body  . Dizziness and weakness   Immunizations Administered    Name Date Dose VIS Date Route   Pfizer COVID-19 Vaccine 08/14/2019 12:49 PM 0.3 mL 05/08/2019 Intramuscular   Manufacturer: [employer], [employer]   Lot: [id number]   NDC: [address]-7

## 2019-09-08 ENCOUNTER — Ambulatory Visit: Payer: No Typology Code available for payment source | Attending: Internal Medicine

## 2019-09-08 DIAGNOSIS — Z23 Encounter for immunization: Secondary | ICD-10-CM

## 2019-09-08 NOTE — Progress Notes (Signed)
   Covid-19 Vaccination Clinic  Name:  Maria Salinas    MRN: [id number] DOB: 06/16/1980  09/08/2019  Maria Salinas was observed post Covid-19 immunization for 15 minutes without incident. She was provided with Vaccine Information Sheet and instruction to access the V-Safe system.   Maria Salinas was instructed to call 911 with any severe reactions post vaccine: Marland Kitchen Difficulty breathing  . Swelling of face and throat  . A fast heartbeat  . A bad rash all over body  . Dizziness and weakness   Immunizations Administered    Name Date Dose VIS Date Route   Pfizer COVID-19 Vaccine 09/08/2019  1:49 PM 0.3 mL 05/08/2019 Intramuscular   Manufacturer: [employer], [employer]   Lot: [id number]   NDC: [address]-2

## 2020-01-30 MED FILL — LEVONOR-ETH ESTRAD 0.15-0.0: 0.15-30 | 84 days supply | Qty: 84 | Fill #2

## 2020-02-02 ENCOUNTER — Other Ambulatory Visit: Payer: Self-pay | Admitting: Obstetrics and Gynecology

## 2020-02-02 ENCOUNTER — Other Ambulatory Visit: Payer: Self-pay

## 2020-02-02 ENCOUNTER — Ambulatory Visit
Admission: RE | Admit: 2020-02-02 | Discharge: 2020-02-02 | Disposition: A | Discharge status: Home / Self Care | Payer: No Typology Code available for payment source | Source: Ambulatory Visit | Attending: Obstetrics and Gynecology | Admitting: Obstetrics and Gynecology

## 2020-02-02 DIAGNOSIS — R928 Other abnormal and inconclusive findings on diagnostic imaging of breast: Secondary | ICD-10-CM

## 2020-02-02 IMAGING — MG MM DIGITAL DIAGNOSTIC UNILAT*R* W/ TOMO W/ CAD
5 series · 6 of 9 positions shown · non-contrast
Comparison: Previous exam(s).

CLINICAL DATA: 39-year-old female presenting as a recall from
screening for possible right breast calcifications.

EXAM:
DIGITAL DIAGNOSTIC UNILATERAL RIGHT MAMMOGRAM WITH TOMO AND CAD

[R MLO]
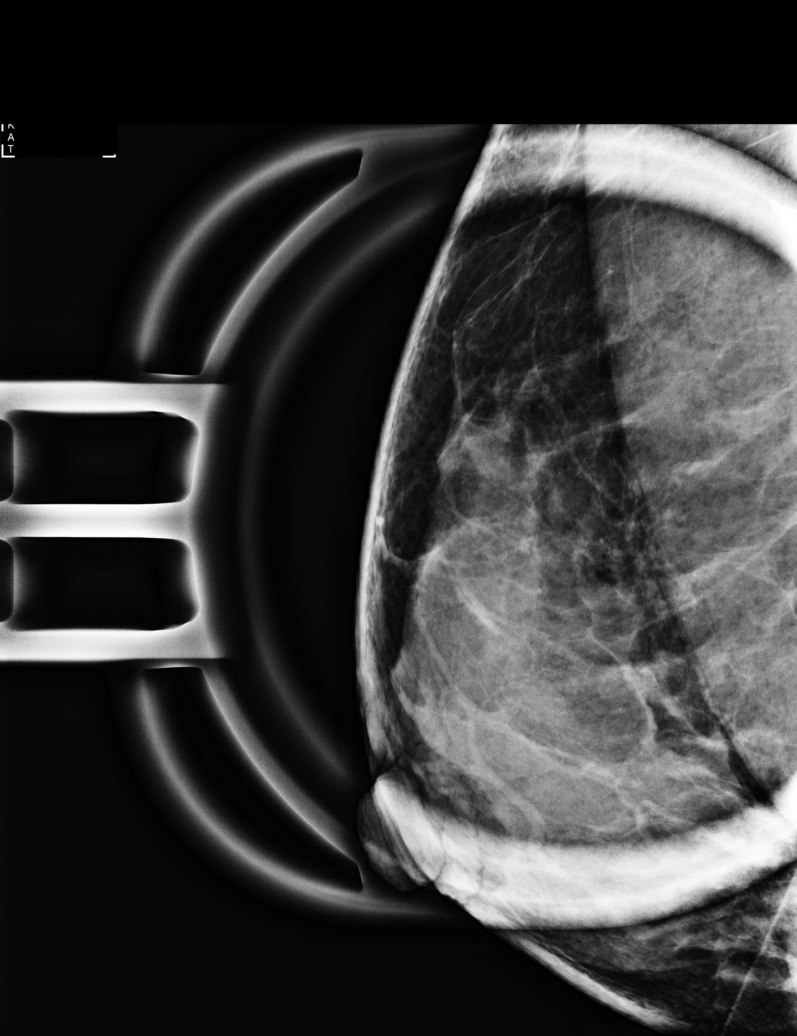

[R ML]
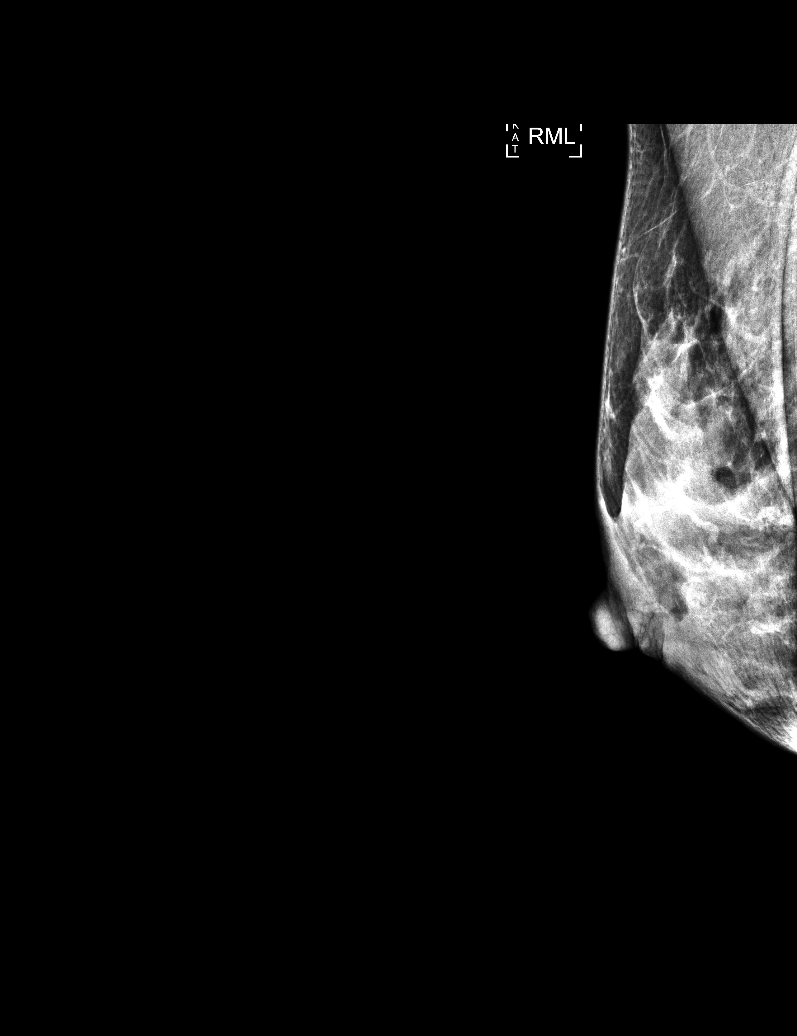

[R XCCL]
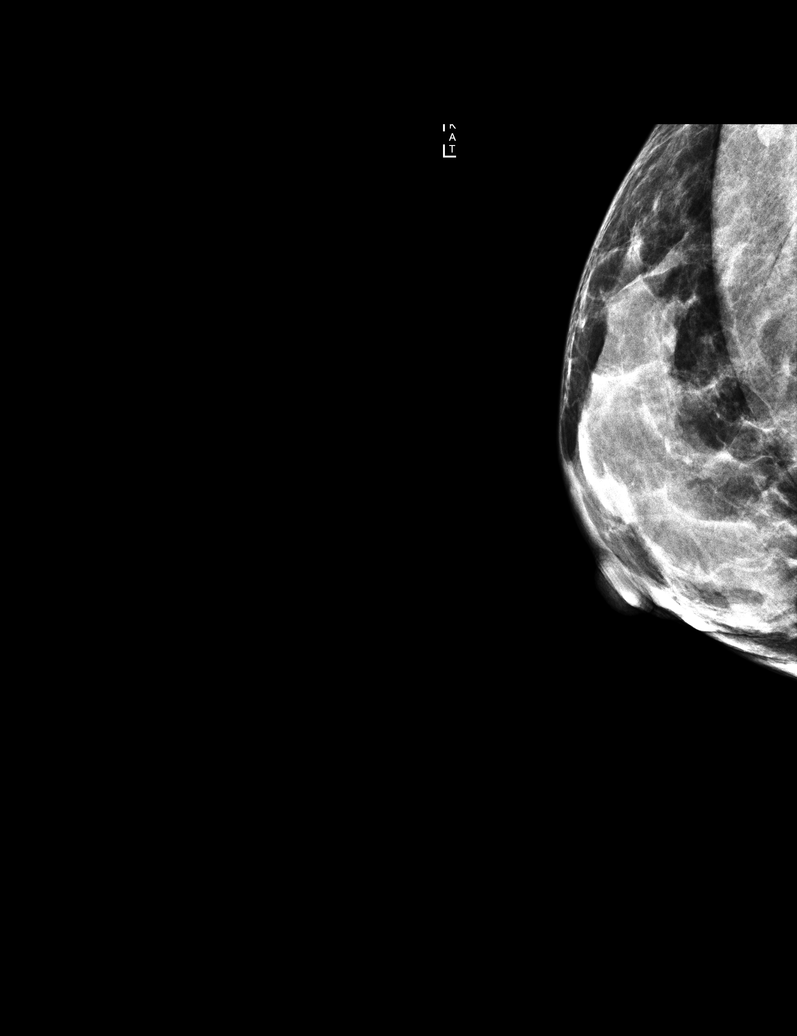

[R MLO synth-2D]
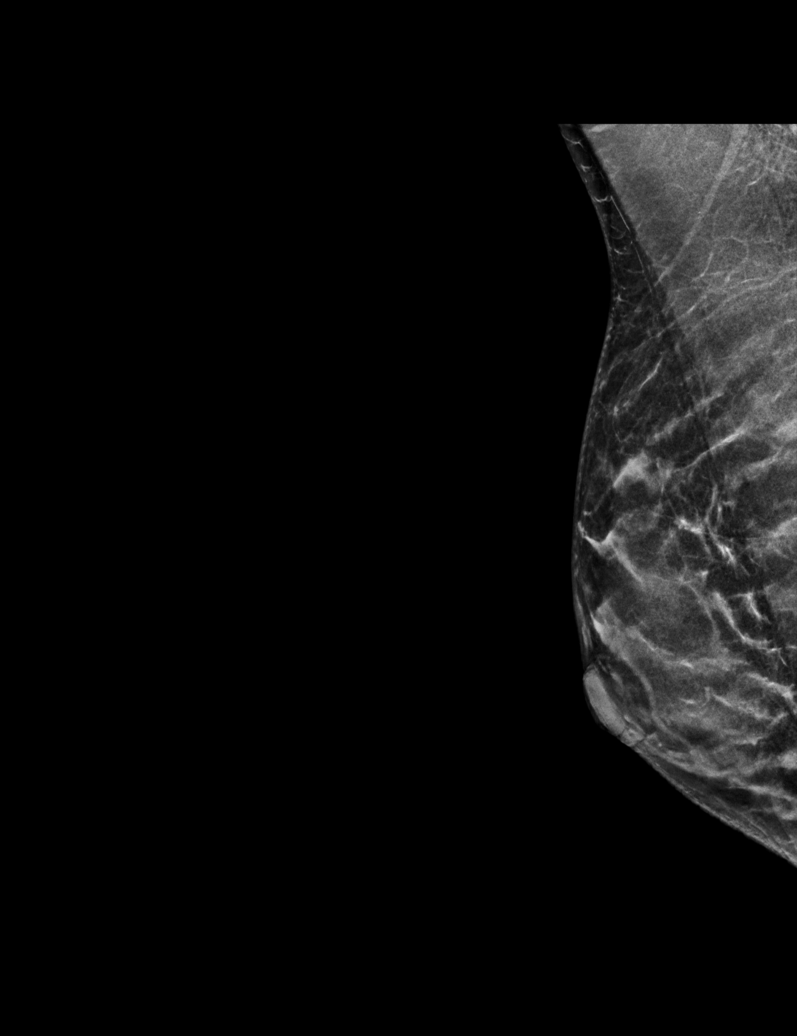

[R MLO tomo · 2 of 40 frames shown]
[frame 13/40]
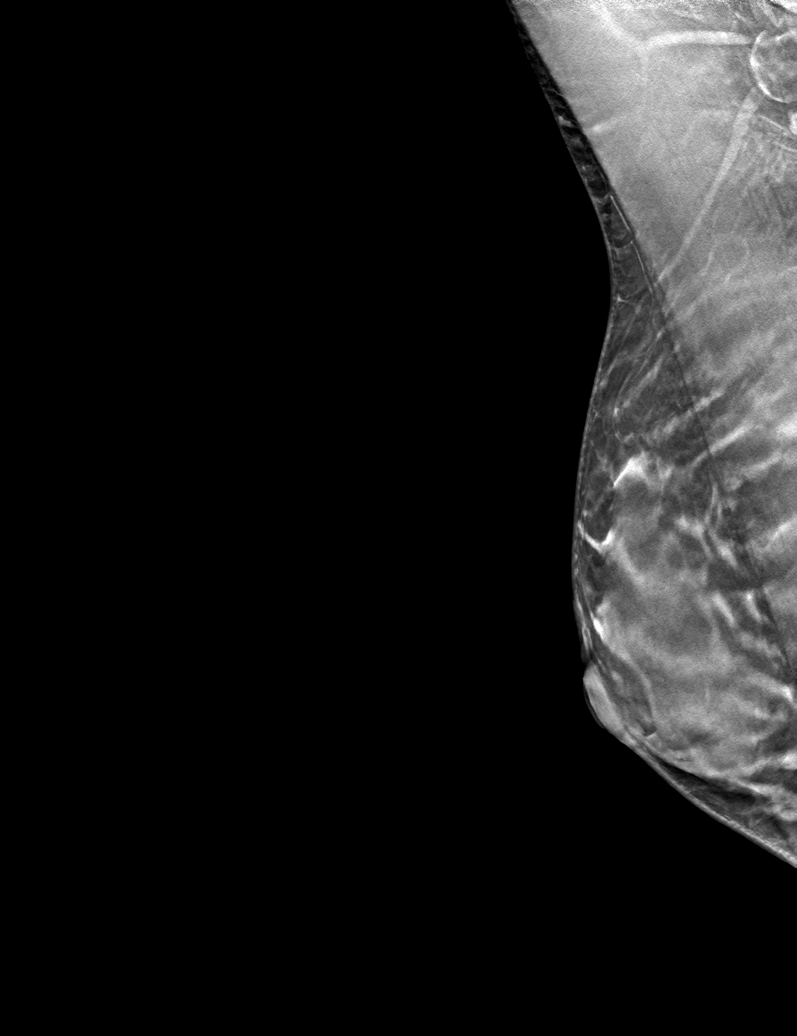
[frame 21/40]
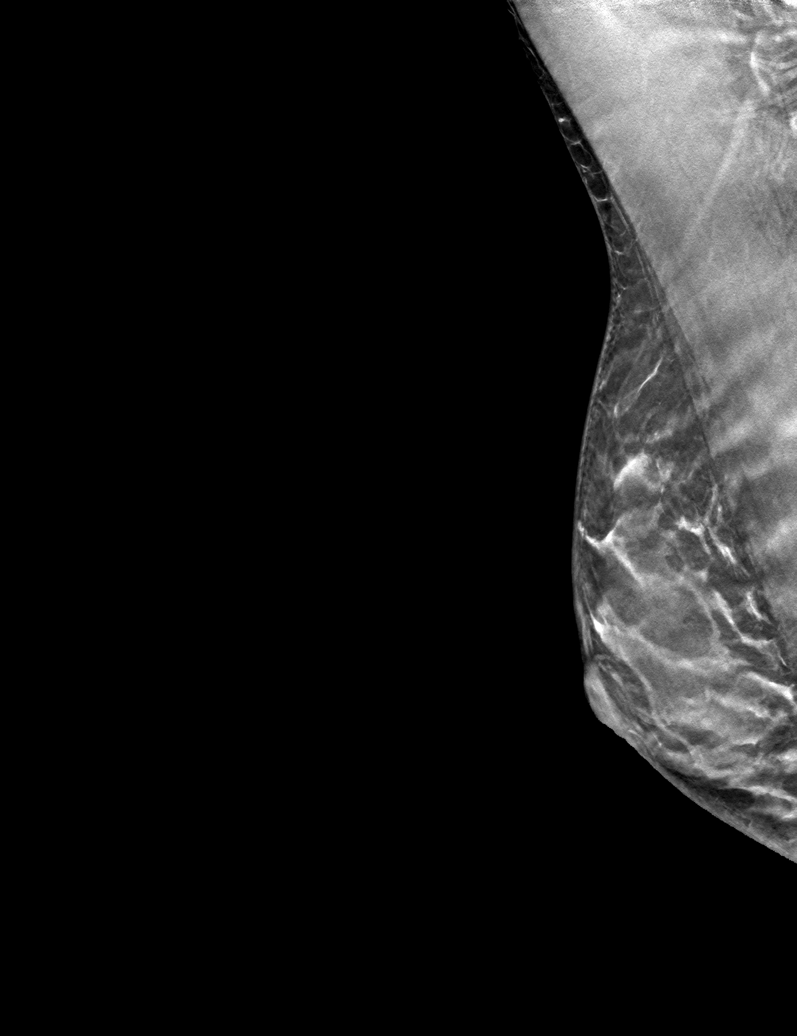

[6 of 9 positions shown; findings below may reference images not displayed]

ACR Breast Density Category d: The breast tissue is extremely dense,
which lowers the sensitivity of mammography.
FINDINGS: Spot 2D magnification and full field tomosynthesis views of the
right breast were performed. The tiny group of possible
calcifications identified on screening mammogram in the superior
right breast did not persist on today's imaging and were likely
artifactual. There is no suspicious finding in the superior right on
today's imaging.

Mammographic images were processed with CAD.
IMPRESSION: The possible tiny group of calcifications identified on screening
mammogram in the superior right breast did not persist on today's
imaging and were likely artifactual.

RECOMMENDATION:
1.  Screening mammogram in one year.(Code:[7J])

2.  Consider high risk screening breast MRI given family history.

I have discussed the findings and recommendations with the patient.
If applicable, a reminder letter will be sent to the patient
regarding the next appointment.

BI-RADS CATEGORY  1: Negative.

## 2020-04-23 MED FILL — LEVONOR-ETH ESTRAD 0.15-0.0: 0.15-30 | 84 days supply | Qty: 84 | Fill #3

## 2020-06-11 ENCOUNTER — Other Ambulatory Visit: Payer: No Typology Code available for payment source

## 2020-06-11 DIAGNOSIS — Z20822 Contact with and (suspected) exposure to covid-19: Secondary | ICD-10-CM

## 2020-06-14 LAB — NOVEL CORONAVIRUS, NAA: SARS-CoV-2, NAA: NOT DETECTED

## 2020-07-18 MED FILL — LEVONOR-ETH ESTRAD 0.15-0.0: 0.15-30 | 84 days supply | Qty: 84 | Fill #4

## 2020-07-27 ENCOUNTER — Encounter: Payer: Self-pay | Admitting: Family Medicine

## 2020-07-28 ENCOUNTER — Ambulatory Visit (INDEPENDENT_AMBULATORY_CARE_PROVIDER_SITE_OTHER): Payer: No Typology Code available for payment source | Admitting: Family Medicine

## 2020-07-28 ENCOUNTER — Other Ambulatory Visit: Payer: Self-pay

## 2020-07-28 ENCOUNTER — Encounter: Payer: Self-pay | Admitting: Family Medicine

## 2020-07-28 VITALS — BP 118/68 | HR 68 | Ht 65.0 in | Wt 122.0 lb

## 2020-07-28 DIAGNOSIS — Z803 Family history of malignant neoplasm of breast: Secondary | ICD-10-CM

## 2020-07-28 DIAGNOSIS — Z7185 Encounter for immunization safety counseling: Secondary | ICD-10-CM

## 2020-07-28 DIAGNOSIS — Z7689 Persons encountering health services in other specified circumstances: Secondary | ICD-10-CM

## 2020-07-28 NOTE — Patient Instructions (Signed)
Please get your flu shot in the Fall. You may get your COVID vaccine any time--fine to wait until next month (3 months from your infection).  If possible, I'd like to enter your TdaP date into your chart. Please let Dr. Renaldo Fiddler know your change in PCP and to send a copy of her report from your visit and any test results.

## 2020-07-28 NOTE — Progress Notes (Signed)
Chief Complaint  Patient presents with  . Establish Care    New patient to establish care. No concerns, just here to establish. Offered covid booster, had covid middle of January. Did not have flu shot and doesn't really want to do today since it is so late into the season.    Patient presents to establish care.  She will need a physical done here by September, as required by her husband's insurance.  She has family h/o breast cancer (mother and maternal aunt). She had genetic testing, along with her sister, and her mother, all negative.  Sees GYN, Dr. Julien Girt, has appt later this month Had call-back on first mammogram, scheduled for 6 mos follow-up.  Immunization History  Administered Date(s) Administered  . Hepatitis B 03/06/1994, 04/10/1994, 09/18/1994  . Influenza Split 03/03/2012  . Influenza,inj,Quad PF,6+ Mos 03/11/2017, 04/07/2019  . MMR 05/28/1997  . PFIZER(Purple Top)SARS-COV-2 Vaccination 08/14/2019, 09/08/2019  . Td 05/29/2011  . Tdap 05/29/2011   Had Tdap with each pregnancy, last in 2019. Had [place] in 05/2020, holding off on getting booster.  Chart and labs reviewed Lab Results  Component Value Date   CHOL 182 03/31/2019   HDL 56 03/31/2019   LDLCALC 112 (H) 03/31/2019   TRIG 47 03/31/2019   CHOLHDL 3.3 03/31/2019   (LDL prev 131)  Past Medical History:  Diagnosis Date  . Anxiety    took lexapro x 2 years after her mother's death  . Asthma    childhood, resolved  . HPV (human papilloma virus) anogenital infection    reportedly resolved without treatment (found on pap)  . Vaginal Pap smear, abnormal    repeat pap normal, no treatment needed   Past Surgical History:  Procedure Laterality Date  . NO PAST SURGERIES     Social History   Tobacco Use  . Smoking status: Never Smoker  . Smokeless tobacco: Never Used  Vaping Use  . Vaping Use: Never used  Substance Use Topics  . Alcohol use: Yes    Alcohol/week: 1.0 standard drink    Types: 1 Glasses of  wine per week    Comment: rare, 1/month  . Drug use: No   Social History   Social History Narrative   Married, 3 children. No pets.   No tobacco exposure.   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 Cancer Mother 30       passed away 50  . Breast cancer Mother 73  . Alcohol abuse Maternal Grandfather   . Heart disease Maternal Grandfather        Heart attack  . Asthma Paternal Grandmother   . Heart disease Paternal Grandmother        Heart attack  . Hyperlipidemia Father   . Diabetes Father        borderline, not on med  . Heart disease Paternal Grandfather        Heart attack  . Alcohol abuse Paternal Grandfather   . Cancer Maternal Aunt        breast  . Breast cancer Maternal Aunt 46    Meds: Combined oral contraceptive (no longer on norethindrone) Has been on it for a year without SE or BTB  No Known Allergies  ROS: no fever, chills, headaches, dizziness, URI symptoms, chest pain, shortness of breath, GI or  GU complaints. No rashes, depression, anxiety, joint pains or any other concerns.    PHYSICAL EXAM:  BP 118/68   Pulse 68   Ht '5\' 5"'  (1.651 m)   Wt 122 lb (55.3 kg)   LMP 07/13/2020 (Exact Date)   Breastfeeding No   BMI 20.30 kg/m    Well developed, well nourished patient, in no distress HEENT: conjunctiva and sclera are clear, EOMI, wearing mask Neck: No lymphadenopathy or thyromegaly, no carotid bruit Heart:  Regular rate and rhythm, no murmurs, rubs, gallops or ectopy Lungs:  Clear bilaterally, without wheezes, rales or ronchi Abdomen:  Soft, nontender, nondistended, no hepatosplenomegaly or masses, normal bowel sounds Extremities:  No clubbing, cyanosis or edema, 2+ pulses.  Neuro:  Alert and oriented x 3, normal gait Back:  No spine or CVA tenderness Skin: no rashes or suspicious lesions (limited  exam, not undressed) Psych:  Normal mood, affect, hygiene and grooming, normal speech, eye contact  ASSESSMENT/PLAN:  Encounter to establish care  Family history of breast cancer  Vaccine counseling - discussed recommendations for COVID booster after illness--minimum 30d, or by 3 months   COVID booster next month  All epic labs in system reviewed, all fine Mild hyperlipidemia in the past, improved.  F/u for CPE prior to September, as required per pt's insurance.

## 2020-08-18 ENCOUNTER — Other Ambulatory Visit (HOSPITAL_COMMUNITY): Payer: Self-pay | Admitting: Obstetrics and Gynecology

## 2020-08-18 LAB — HM PAP SMEAR: HM Pap smear: NEGATIVE

## 2020-08-18 LAB — RESULTS CONSOLE HPV: CHL HPV: NEGATIVE

## 2020-08-19 ENCOUNTER — Other Ambulatory Visit (HOSPITAL_COMMUNITY): Payer: Self-pay | Admitting: Obstetrics and Gynecology

## 2020-08-19 DIAGNOSIS — Z9189 Other specified personal risk factors, not elsewhere classified: Secondary | ICD-10-CM

## 2020-09-01 ENCOUNTER — Encounter: Payer: Self-pay | Admitting: *Deleted

## 2020-09-01 ENCOUNTER — Encounter (HOSPITAL_COMMUNITY): Payer: Self-pay

## 2020-09-01 ENCOUNTER — Ambulatory Visit (HOSPITAL_COMMUNITY): Payer: No Typology Code available for payment source

## 2020-09-07 ENCOUNTER — Encounter: Payer: Self-pay | Admitting: Family Medicine

## 2020-09-09 ENCOUNTER — Other Ambulatory Visit: Payer: Self-pay

## 2020-09-09 ENCOUNTER — Ambulatory Visit (HOSPITAL_COMMUNITY)
Admission: RE | Admit: 2020-09-09 | Discharge: 2020-09-09 | Disposition: A | Discharge status: Home / Self Care | Payer: No Typology Code available for payment source | Source: Ambulatory Visit | Attending: Obstetrics and Gynecology | Admitting: Obstetrics and Gynecology

## 2020-09-09 DIAGNOSIS — Z9189 Other specified personal risk factors, not elsewhere classified: Secondary | ICD-10-CM | POA: Insufficient documentation

## 2020-09-09 IMAGING — MR MR BREAST BILAT WO/W CM
8 of 11 series · 30 of 48 positions shown · IV contrast (gadavist)
Comparison: Bilateral screening mammogram dated [DATE] and
right diagnostic mammogram dated [DATE].

CLINICAL DATA: High risk for breast cancer. Strong family history
of breast cancer including the patient's mother diagnosed at age 46.

LABS:  None obtained on site today.
EXAM:
BILATERAL BREAST MRI WITH AND WITHOUT CONTRAST
TECHNIQUE: Multiplanar, multisequence MR images of both breasts were obtained
prior to and following the intravenous administration of 6 ml of
Gadavist

[Series 2: T2 · axial · 3.0mm · 0.78mm/px · z∈[-140,+40]mm · 4 of 60 slices shown]
[im 1/60]
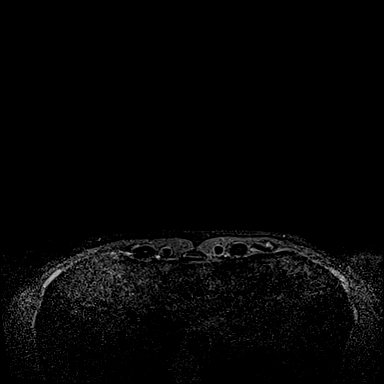
[im 20/60]
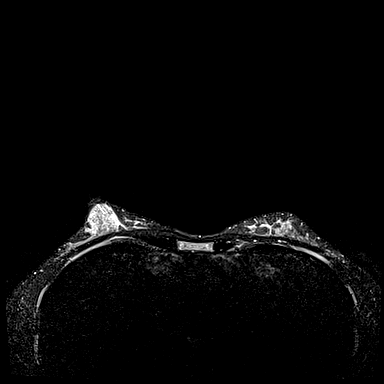
[im 40/60]
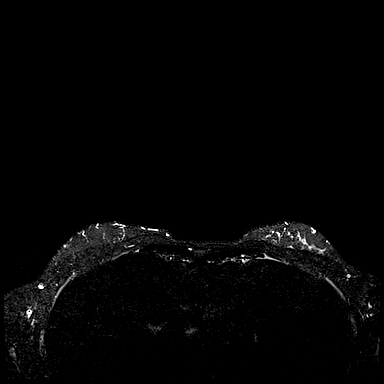
[im 60/60]
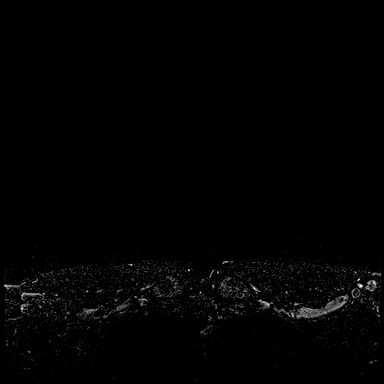

[Series 3: T1 fat-sat · axial · 1.2mm · 0.67mm/px · z∈[-126,+27]mm · 7 of 128 slices shown (1 of 4)]
[im 1/128]
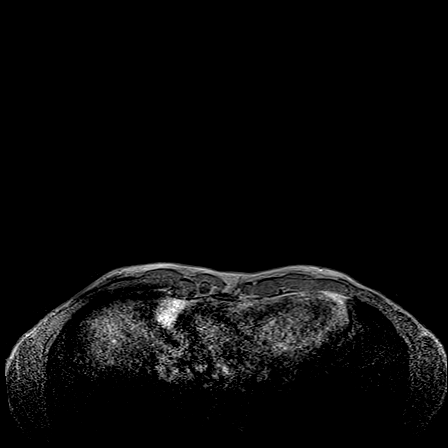
[im 22/128]
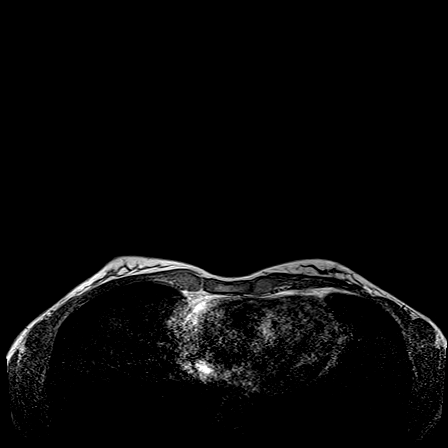
[im 43/128]
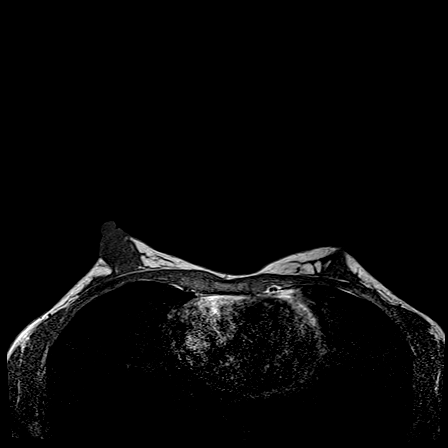
[im 64/128]
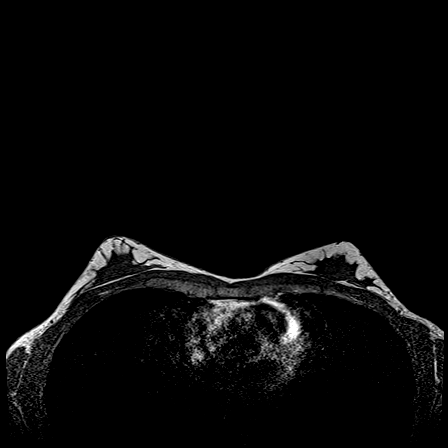
[im 85/128]
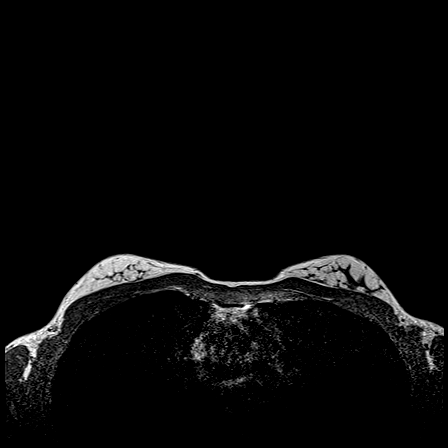
[im 106/128]
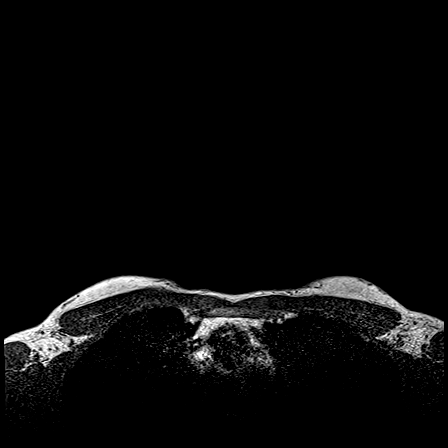
[im 128/128]
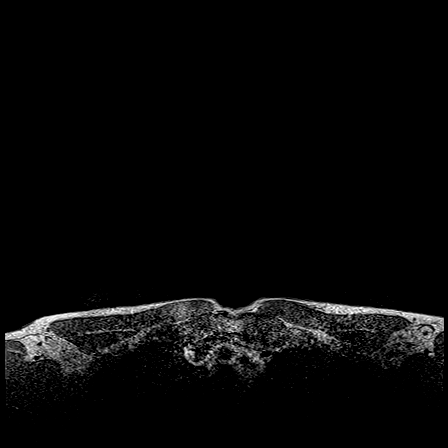

[Series 5: T1 fat-sat · axial · 1.6mm · 0.72mm/px · z∈[-138,+39]mm · 5 of 112 slices shown (2 of 4)]
[im 1/112]
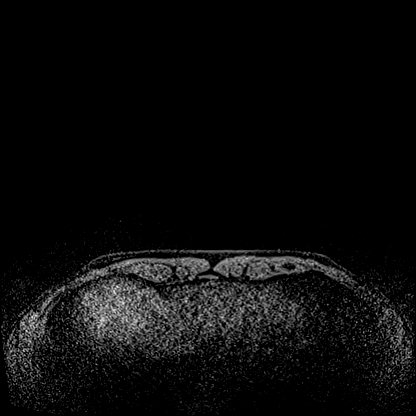
[im 28/112]
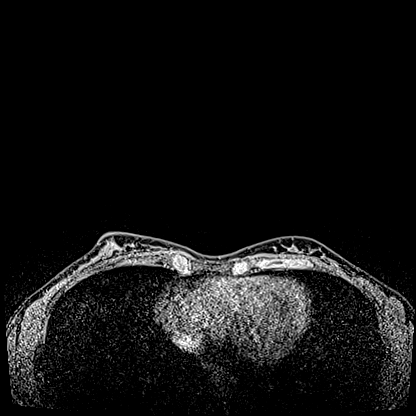
[im 56/112]
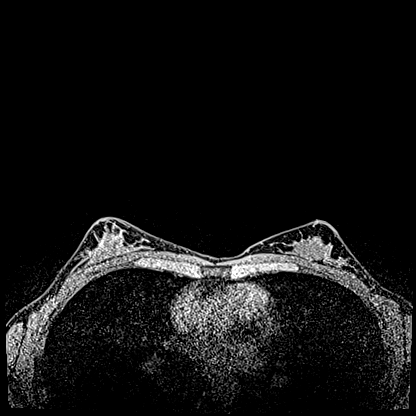
[im 84/112]
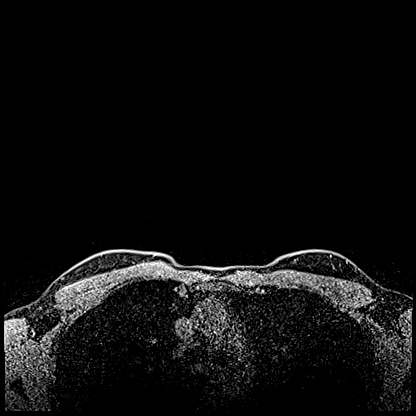
[im 112/112]
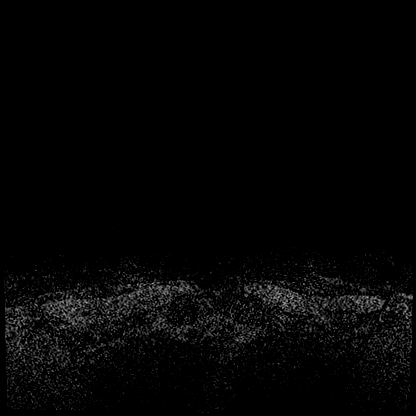

[Series 6: T1 fat-sat · axial · 1.6mm · 0.72mm/px · z∈[-138,+39]mm · 5 of 112 slices shown (3 of 4)]
[im 1/112]
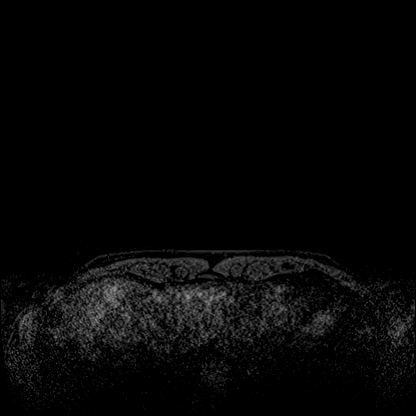
[im 28/112]
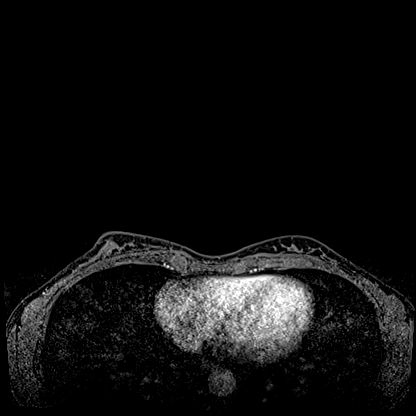
[im 56/112]
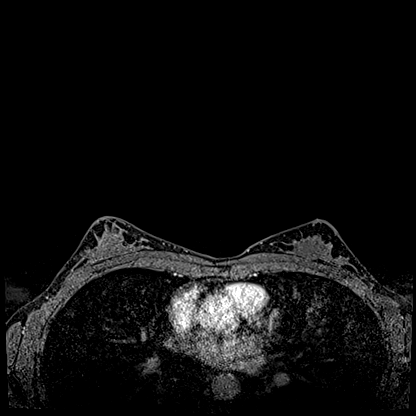
[im 84/112]
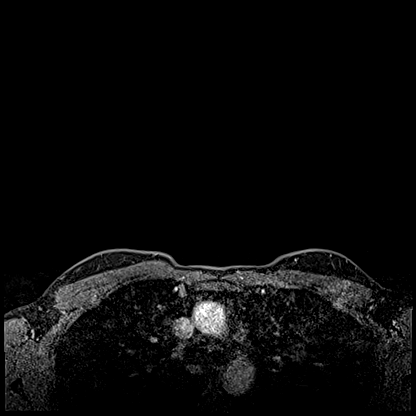
[im 112/112]
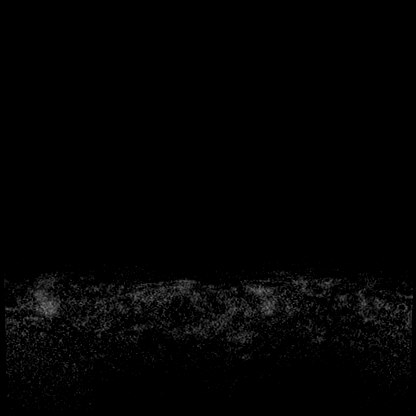

[Series 7: T1 · axial · 1.6mm · 0.72mm/px · z∈[-138,+39]mm · 5 of 112 slices shown (1 of 3)]
[im 1/112]
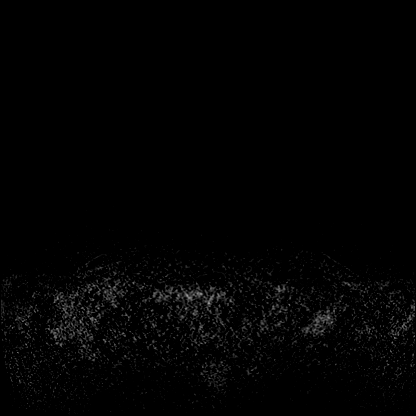
[im 28/112]
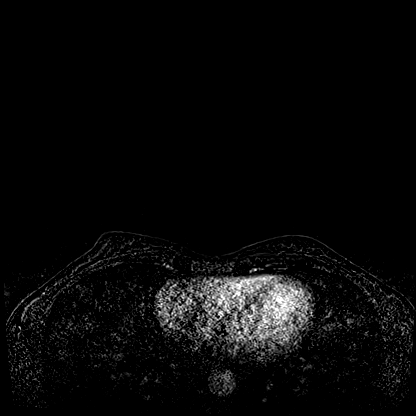
[im 56/112]
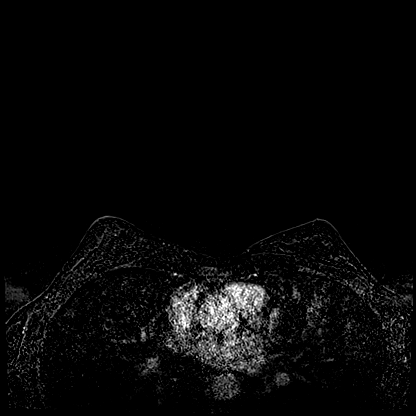
[im 84/112]
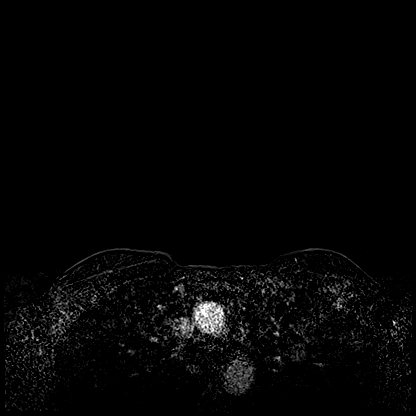
[im 112/112]
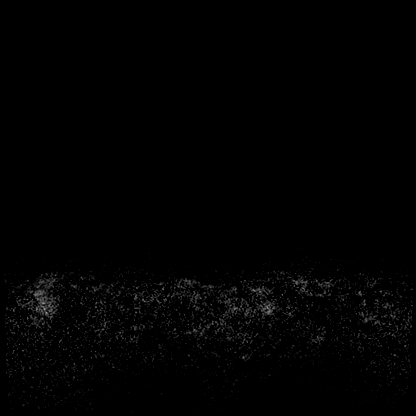

[Series 8: T1 · coronal · 300.0mm · 0.72mm/px · 1 of 3 slices shown (2 of 3)]
[im 1/3]
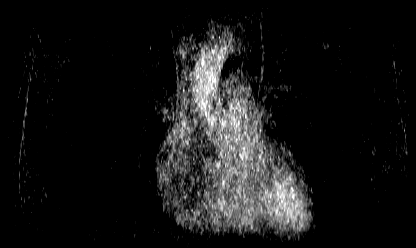

[Series 9: T1 · axial · 179.2mm · 0.72mm/px · 1 of 3 slices shown (3 of 3)]
[im 1/3]
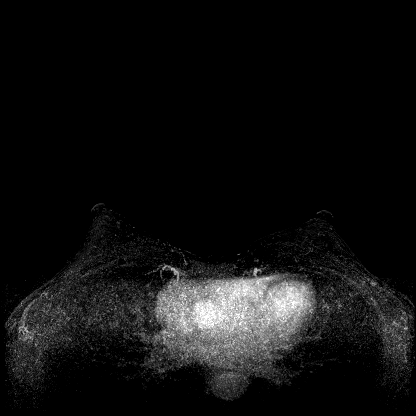

[Series 10: T1 fat-sat · axial · 1.6mm · 0.72mm/px · z∈[-138,-95]mm · 2 of 112 slices shown (4 of 4)]
[im 1/112]
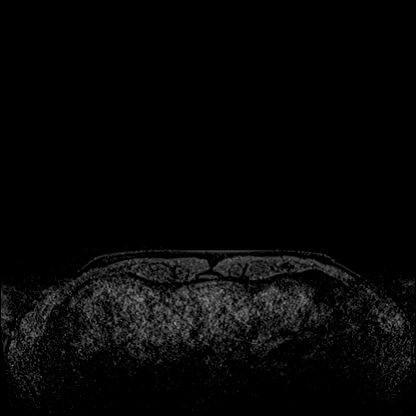
[im 28/112]
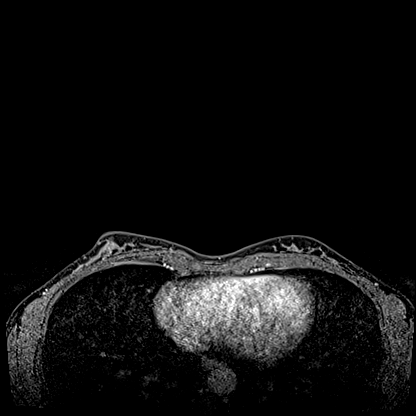

[30 of 48 positions shown; findings below may reference images not displayed]

Three-dimensional MR images were rendered by post-processing of the
original MR data on an independent workstation. The
three-dimensional MR images were interpreted, and findings are
reported in the following complete MRI report for this study. Three
dimensional images were evaluated at the independent interpreting
workstation using the DynaCAD thin client.
FINDINGS: Breast composition: d. Extreme fibroglandular tissue.

Background parenchymal enhancement: Minimal.

Right breast: No mass or abnormal enhancement.

Left breast: No mass or abnormal enhancement.

Lymph nodes: No abnormal appearing lymph nodes.

Ancillary findings:  None.
IMPRESSION: Normal examination.  No evidence of malignancy.

RECOMMENDATION:
1. Bilateral screening mammogram in 5 months when due.
2. Per American Cancer Society guidelines, if the patient has a
calculated lifetime risk of developing breast cancer of greater than
20%, annual screening MRI of the breasts would also be recommended.

BI-RADS CATEGORY  1: Negative.

## 2020-09-09 MED ORDER — GADOBUTROL 1 MMOL/ML IV SOLN
6.0000 mL | Freq: Once | INTRAVENOUS | Status: AC | PRN
  Administered 2020-09-09: 6 mL via INTRAVENOUS

## 2020-09-22 ENCOUNTER — Encounter: Payer: No Typology Code available for payment source | Admitting: Family Medicine

## 2020-10-10 ENCOUNTER — Other Ambulatory Visit (HOSPITAL_BASED_OUTPATIENT_CLINIC_OR_DEPARTMENT_OTHER): Payer: Self-pay

## 2020-10-10 MED FILL — Levonorgestrel & Ethinyl Estradiol Tab 0.15 MG-30 MCG: ORAL | 84 days supply | Qty: 84 | Fill #0 | Status: AC

## 2020-10-11 ENCOUNTER — Other Ambulatory Visit (HOSPITAL_BASED_OUTPATIENT_CLINIC_OR_DEPARTMENT_OTHER): Payer: Self-pay

## 2020-12-27 ENCOUNTER — Other Ambulatory Visit (HOSPITAL_BASED_OUTPATIENT_CLINIC_OR_DEPARTMENT_OTHER): Payer: Self-pay

## 2020-12-27 MED FILL — Levonorgestrel & Ethinyl Estradiol Tab 0.15 MG-30 MCG: ORAL | 84 days supply | Qty: 84 | Fill #1 | Status: AC

## 2021-01-01 NOTE — Progress Notes (Signed)
Chief Complaint  Patient presents with   Annual Exam    Fasting annual exam no pap-sees GYN. Verified that she does need labs or form for husband's insurance. Will schedule [place] for flu/covid booster early-mid Sept-planning trip late Oct to [place].     Maria Salinas is a 40 y.o. female who presents for a complete physical.  She sees GYN regularly, last in 07/2020. She has no concerns.  Asking about COVID boosters, when to get.  Going to [place] late October, would like to have something on board prior to going.   Immunization History  Administered Date(s) Administered   Hepatitis B 03/06/1994, 04/10/1994, 09/18/1994   Influenza Split 03/03/2012   Influenza,inj,Quad PF,6+ Mos 03/11/2017, 04/07/2019   MMR 05/28/1997   PFIZER(Purple Top)SARS-COV-2 Vaccination 08/14/2019, 09/08/2019   Td 05/29/2011   Tdap 05/29/2011, 04/16/2018  Had COVID 05/2020 Last Pap smear: 07/2020 Last mammogram: 01/2020; had MRI 08/2020 (due to high risk), normal Last colonoscopy: never Last DEXA: never Dentist: twice yearly Ophtho: once 10 years ago Exercise: active at work and with her kids. This summer--not working--30 min walks, tennis, basketball.  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 Hasn't gone to the gym since [place] (used to go to [employer], classes)  Had CBC, c-met in 03/2019. States she gets Hgb checked by GYN yearly Lipids: Lab Results  Component Value Date   CHOL 182 03/31/2019   HDL 56 03/31/2019   LDLCALC 112 (H) 03/31/2019   TRIG 47 03/31/2019   CHOLHDL 3.3 03/31/2019   Thyroid screen: Lab Results  Component Value Date   TSH 0.78 09/09/2015    PMH, PSH, SH and FH were reviewed and updated  Outpatient Encounter Medications as of 01/02/2021  Medication Sig   levonorgestrel-ethinyl estradiol (NORDETTE) 0.15-30 MG-MCG tablet TAKE 1 TABLET BY MOUTH DAILY   [DISCONTINUED] levonorgestrel-ethinyl estradiol (NORDETTE) 0.15-30 MG-MCG tablet TAKE 1 TABLET BY MOUTH DAILY   No  facility-administered encounter medications on file as of 01/02/2021.   No Known Allergies  ROS: The patient denies anorexia, fever, weight changes, headaches,  vision changes, decreased hearing, ear pain, sore throat, breast concerns, chest pain, palpitations, dizziness, syncope, dyspnea on exertion, cough, swelling, nausea, vomiting, diarrhea, constipation, abdominal pain, melena, hematochezia, indigestion/heartburn, hematuria, incontinence, dysuria, irregular menstrual cycles, vaginal discharge, odor or itch, genital lesions, joint pains, numbness, tingling, weakness, tremor, suspicious skin lesions, depression, anxiety, abnormal bleeding/bruising, or enlarged lymph nodes.   PHYSICAL EXAM:  BP 128/80   Pulse 80   Ht '5\' 5"'  (1.651 m)   Wt 122 lb 6.4 oz (55.5 kg)   BMI 20.37 kg/m   Wt Readings from Last 3 Encounters:  01/02/21 122 lb 6.4 oz (55.5 kg)  07/28/20 122 lb (55.3 kg)  04/07/19 114 lb (51.7 kg)   General Appearance:    Alert, cooperative, no distress, appears stated age  Head:    Normocephalic, without obvious abnormality, atraumatic  Eyes:    PERRL, conjunctiva/corneas clear, EOM's intact, fundi benign  Ears:    Normal TM's and external ear canals  Nose:   Not examined, wearing mask due to COVID-19 pandemic  Throat:   Not examined, wearing mask due to COVID-19 pandemic  Neck:   Supple, no lymphadenopathy;  thyroid:  no enlargement/tenderness/nodules; no carotid bruit or JVD  Back:    Spine nontender, no curvature, ROM normal, no CVA tenderness  Lungs:     Clear to auscultation bilaterally without wheezes, rales or ronchi; respirations unlabored  Chest Wall:  No tenderness or deformity   Heart:    Regular rate and rhythm, S1 and S2 normal, no murmur, rub or gallop  Breast Exam:    Deferred to GYN  Abdomen:     Soft, non-tender, nondistended, normoactive bowel sounds, no masses, no hepatosplenomegaly  Genitalia:    Deferred to GYN     Extremities:   No clubbing, cyanosis  or edema  Pulses:   2+ and symmetric all extremities  Skin:   Skin color, texture, turgor normal, no rashes or lesions  Lymph nodes:   Cervical, supraclavicular, and axillary nodes normal  Neurologic:   Normal strength, sensation and gait; reflexes 2+ and symmetric throughout                       Psych:   Normal mood, affect, hygiene and grooming.      ASSESSMENT/PLAN:  Annual physical exam - Plan: POCT Urinalysis DIP (Proadvantage Device), Visual acuity screening, VITAMIN D 25 Hydroxy (Vit-D Deficiency, Fractures), Hepatitis C antibody  Need for hepatitis C screening test - Plan: Hepatitis C antibody  Never had any vitamin D screening, doesn't take vitamins. Will check today. Discussed plans based on results (if on low end of normal, to take supplement in winter months; if low, replace and take supplement daily after rx) Uses pharmacy at [facility] facility.  Discussed COVID boosters--had COVID in January.  Current booster not covering omicron variants well--elects to wait for new booster (though may change mind if won't be out prior to her trip).  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  Immunization recommendations discussed, yearly flu shots recommended.  Discussed COVID boosters.  Colonoscopy recommendations reviewed, age 57.  CPE at 42 if seeing GYN, o/w yearly here.

## 2021-01-01 NOTE — Patient Instructions (Addendum)
  HEALTH MAINTENANCE RECOMMENDATIONS:  It is recommended that you get at least 30 minutes of aerobic exercise at least 5 days/week (for weight loss, you may need as much as 60-90 minutes). This can be any activity that gets your heart rate up. This can be divided in 10-15 minute intervals if needed, but try and build up your endurance at least once a week.  Weight bearing exercise is also recommended twice weekly.  Eat a healthy diet with lots of vegetables, fruits and fiber.  "Colorful" foods have a lot of vitamins (ie green vegetables, tomatoes, red peppers, etc).  Limit sweet tea, regular sodas and alcoholic beverages, all of which has a lot of calories and sugar.  Up to 1 alcoholic drink daily may be beneficial for women (unless trying to lose weight, watch sugars).  Drink a lot of water.  Calcium recommendations are 1200-1500 mg daily (1500 mg for postmenopausal women or women without ovaries), and vitamin D 1000 IU daily.  This should be obtained from diet and/or supplements (vitamins), and calcium should not be taken all at once, but in divided doses.  Monthly self breast exams and yearly mammograms for women over the age of 59 is recommended.  Sunscreen of at least SPF 30 should be used on all sun-exposed parts of the skin when outside between the hours of 10 am and 4 pm (not just when at beach or pool, but even with exercise, golf, tennis, and yard work!)  Use a sunscreen that says "broad spectrum" so it covers both UVA and UVB rays, and make sure to reapply every 1-2 hours.  Remember to change the batteries in your smoke detectors when changing your clock times in the spring and fall. Carbon monoxide detectors are recommended for your home.  Use your seat belt every time you are in a car, and please drive safely and not be distracted with cell phones and texting while driving.  COVID booster is recommended--okay to wait until the new ones are out that cover the omicron variants  better.  Get a flu shot in Sept/October.  We will contact you with your vitamin D results--if normal now, you should consider a 1000 IU Vitamin D3 supplement in October through March/April.  If low now, we will replace, and you should take a vitamin D3 supplement year-round, long-term.  If still seeing GYN yearly, okay to return here at age 82 for a fasting physical. If you prefer to come here instead of OB-GYN, you should be seen yearly.

## 2021-01-02 ENCOUNTER — Encounter: Payer: Self-pay | Admitting: Family Medicine

## 2021-01-02 ENCOUNTER — Ambulatory Visit (INDEPENDENT_AMBULATORY_CARE_PROVIDER_SITE_OTHER): Payer: No Typology Code available for payment source | Admitting: Family Medicine

## 2021-01-02 ENCOUNTER — Other Ambulatory Visit: Payer: Self-pay

## 2021-01-02 VITALS — BP 128/80 | HR 80 | Ht 65.0 in | Wt 122.4 lb

## 2021-01-02 DIAGNOSIS — Z1159 Encounter for screening for other viral diseases: Secondary | ICD-10-CM

## 2021-01-02 DIAGNOSIS — Z0000 Encounter for general adult medical examination without abnormal findings: Secondary | ICD-10-CM

## 2021-01-02 LAB — POCT URINALYSIS DIP (PROADVANTAGE DEVICE)
Bilirubin, UA: NEGATIVE
Glucose, UA: NEGATIVE mg/dL
Ketones, POC UA: NEGATIVE mg/dL
Leukocytes, UA: NEGATIVE
Nitrite, UA: NEGATIVE
Protein Ur, POC: NEGATIVE mg/dL
Specific Gravity, Urine: 1.02
Urobilinogen, Ur: NEGATIVE
pH, UA: 6 (ref 5.0–8.0)

## 2021-01-03 LAB — HEPATITIS C ANTIBODY: Hep C Virus Ab: 0.1 s/co ratio (ref 0.0–0.9)

## 2021-01-03 LAB — VITAMIN D 25 HYDROXY (VIT D DEFICIENCY, FRACTURES): Vit D, 25-Hydroxy: 49.5 ng/mL (ref 30.0–100.0)

## 2021-03-13 ENCOUNTER — Other Ambulatory Visit (HOSPITAL_BASED_OUTPATIENT_CLINIC_OR_DEPARTMENT_OTHER): Payer: Self-pay

## 2021-03-13 MED FILL — Levonorgestrel & Ethinyl Estradiol Tab 0.15 MG-30 MCG: ORAL | 84 days supply | Qty: 84 | Fill #2 | Status: AC

## 2021-04-26 ENCOUNTER — Other Ambulatory Visit (HOSPITAL_COMMUNITY): Payer: Self-pay | Admitting: Obstetrics and Gynecology

## 2021-04-26 ENCOUNTER — Other Ambulatory Visit: Payer: Self-pay | Admitting: Obstetrics and Gynecology

## 2021-04-26 DIAGNOSIS — Z9189 Other specified personal risk factors, not elsewhere classified: Secondary | ICD-10-CM

## 2021-04-26 DIAGNOSIS — Z803 Family history of malignant neoplasm of breast: Secondary | ICD-10-CM

## 2021-06-08 MED FILL — Levonorgestrel & Ethinyl Estradiol Tab 0.15 MG-30 MCG: ORAL | 84 days supply | Qty: 84 | Fill #3 | Status: AC

## 2021-06-09 ENCOUNTER — Other Ambulatory Visit (HOSPITAL_BASED_OUTPATIENT_CLINIC_OR_DEPARTMENT_OTHER): Payer: Self-pay

## 2021-06-15 ENCOUNTER — Other Ambulatory Visit (HOSPITAL_BASED_OUTPATIENT_CLINIC_OR_DEPARTMENT_OTHER): Payer: Self-pay

## 2021-07-18 ENCOUNTER — Ambulatory Visit (HOSPITAL_COMMUNITY): Admission: RE | Admit: 2021-07-18 | Payer: No Typology Code available for payment source | Source: Ambulatory Visit

## 2021-07-18 ENCOUNTER — Encounter (HOSPITAL_COMMUNITY): Payer: Self-pay

## 2021-07-25 ENCOUNTER — Ambulatory Visit (HOSPITAL_COMMUNITY): Payer: No Typology Code available for payment source

## 2021-08-05 ENCOUNTER — Ambulatory Visit (HOSPITAL_COMMUNITY)
Admission: RE | Admit: 2021-08-05 | Discharge: 2021-08-05 | Disposition: A | Discharge status: Home / Self Care | Payer: No Typology Code available for payment source | Source: Ambulatory Visit | Attending: Obstetrics and Gynecology | Admitting: Obstetrics and Gynecology

## 2021-08-05 DIAGNOSIS — Z803 Family history of malignant neoplasm of breast: Secondary | ICD-10-CM | POA: Insufficient documentation

## 2021-08-05 DIAGNOSIS — Z9189 Other specified personal risk factors, not elsewhere classified: Secondary | ICD-10-CM | POA: Insufficient documentation

## 2021-08-05 IMAGING — MR MR BREAST BILAT WO/W CM
6 of 9 series · 29 of 48 positions shown · IV contrast (GADAVIST)
Comparison: [DATE]

CLINICAL DATA: High risk for breast cancer. Family history of
breast cancer.

EXAM:
BILATERAL BREAST MRI WITH AND WITHOUT CONTRAST
TECHNIQUE: Multiplanar, multisequence MR images of both breasts were obtained
prior to and following the intravenous administration of 6 ml of
Gadavist

[Series 2: T2 · axial · 3.0mm · 0.86mm/px · z∈[-76,+86]mm · 4 of 55 slices shown]
[im 1/55]
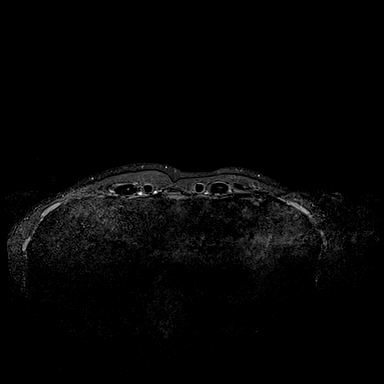
[im 19/55]
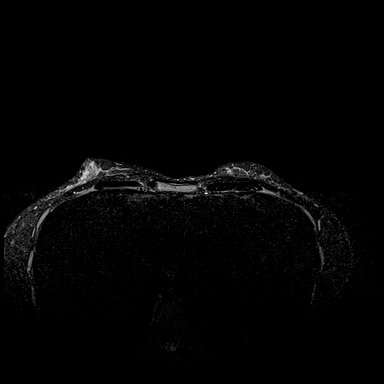
[im 37/55]
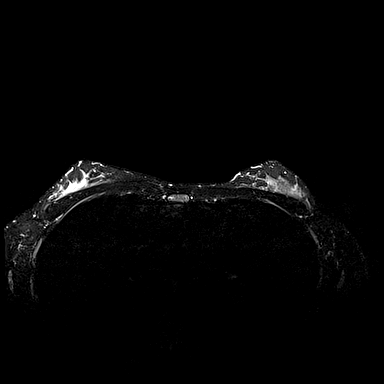
[im 55/55]
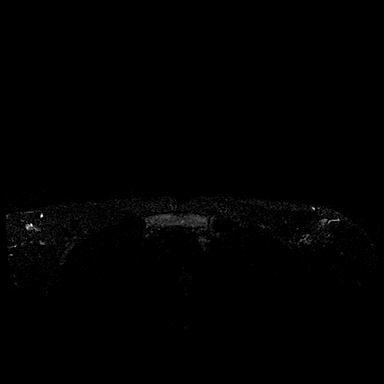

[Series 3: T1 fat-sat · axial · 1.2mm · 0.78mm/px · z∈[-81,+91]mm · 8 of 143 slices shown (1 of 4)]
[im 1/143]
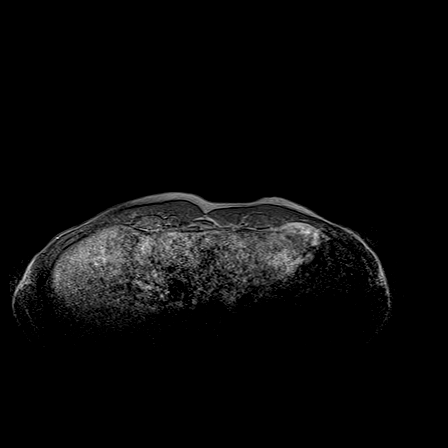
[im 21/143]
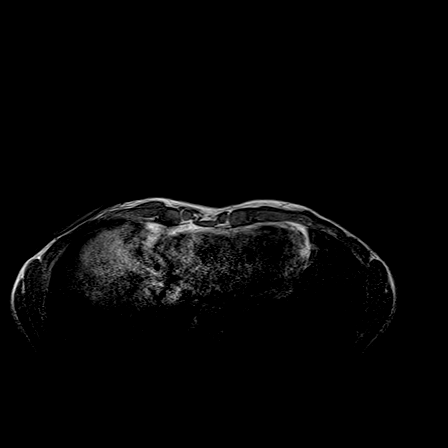
[im 41/143]
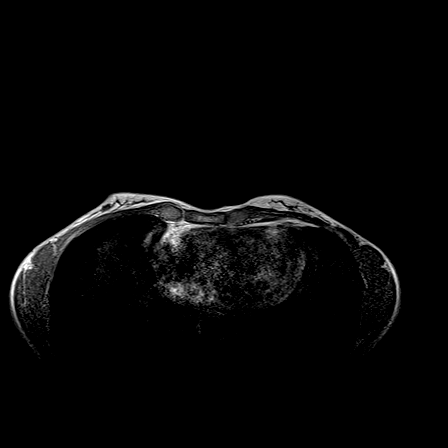
[im 61/143]
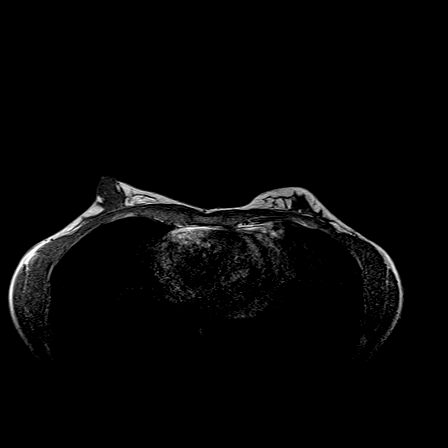
[im 82/143]
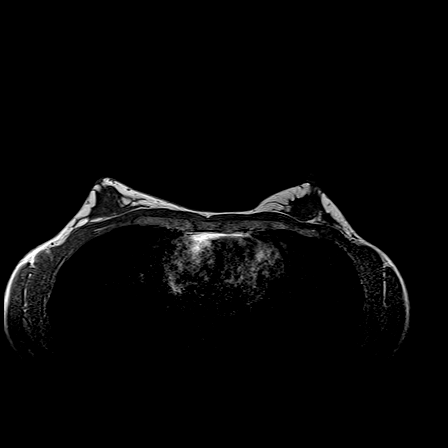
[im 102/143]
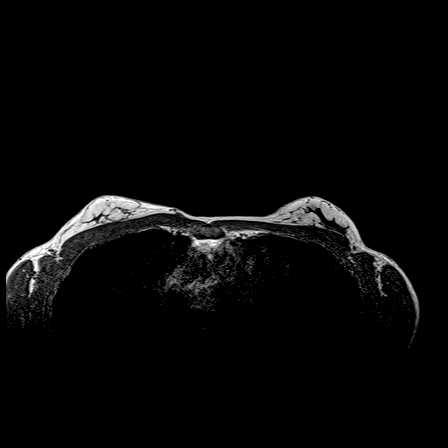
[im 122/143]
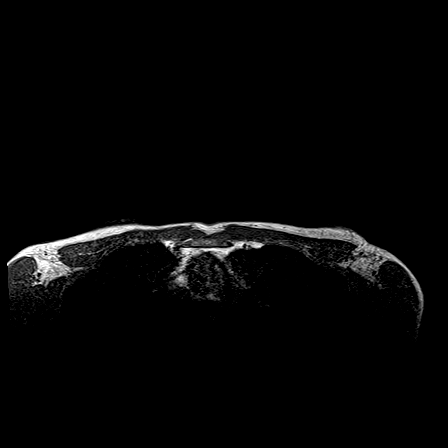
[im 143/143]
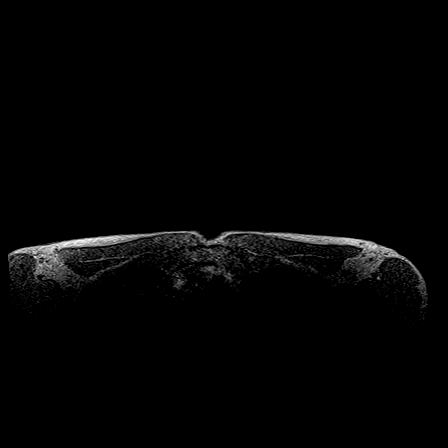

[Series 5: T1 fat-sat · axial · 1.2mm · 0.79mm/px · z∈[-57,+76]mm · 6 of 112 slices shown (2 of 4)]
[im 1/112]
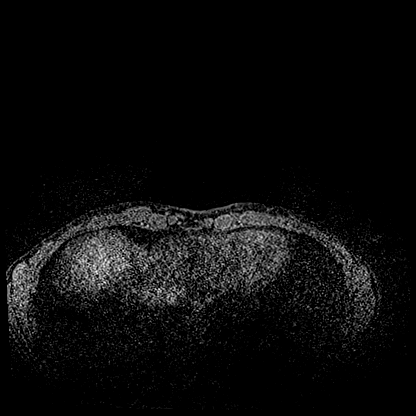
[im 23/112]
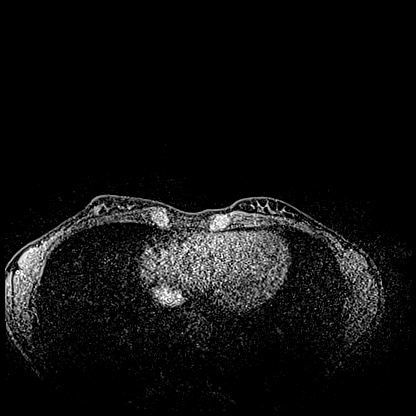
[im 45/112]
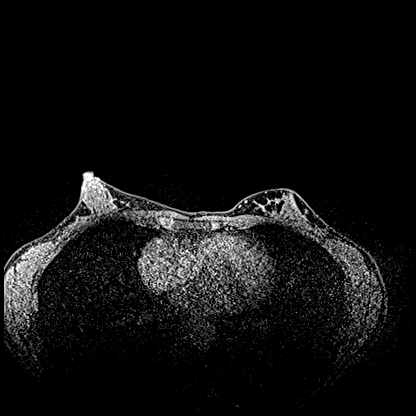
[im 67/112]
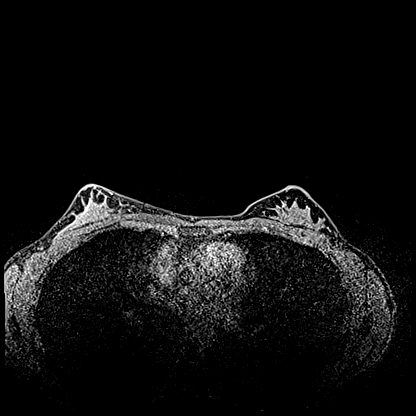
[im 89/112]
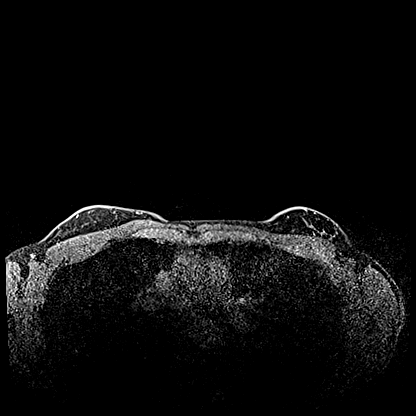
[im 112/112]
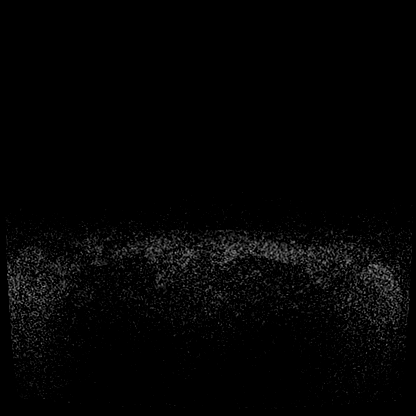

[Series 6: T1 fat-sat · axial · 1.2mm · 0.79mm/px · z∈[-57,+76]mm · 5 of 112 slices shown (3 of 4)]
[im 1/112]
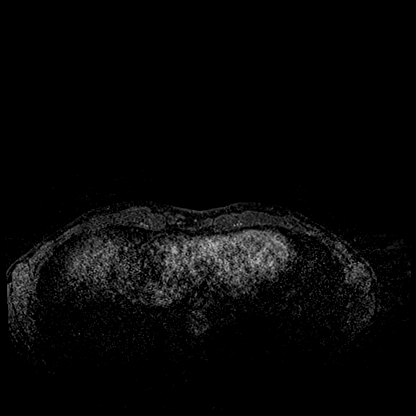
[im 28/112]
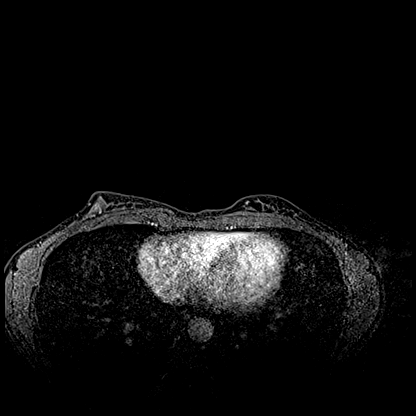
[im 56/112]
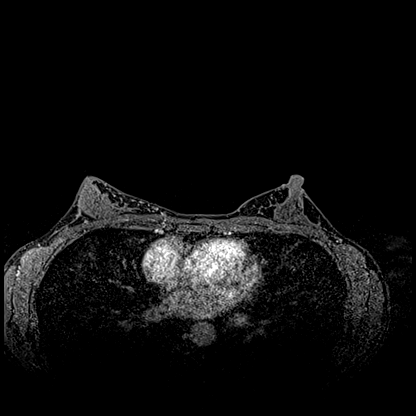
[im 84/112]
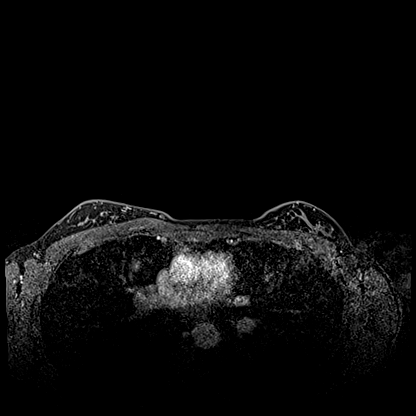
[im 112/112]
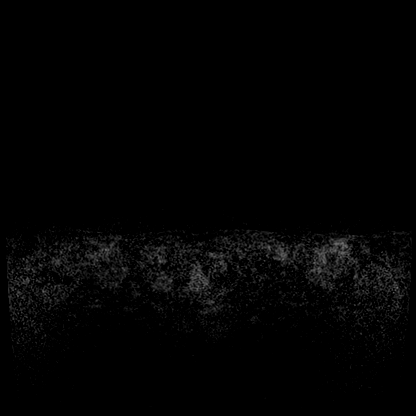

[Series 7: T1 · axial · 1.2mm · 0.79mm/px · z∈[-57,+76]mm · 5 of 112 slices shown]
[im 1/112]
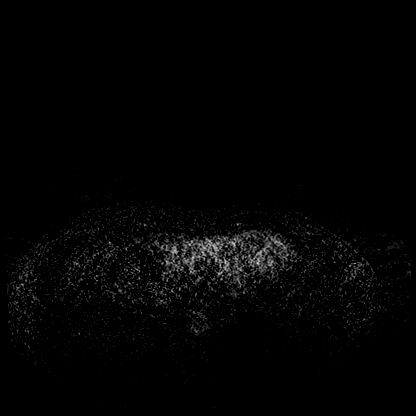
[im 28/112]
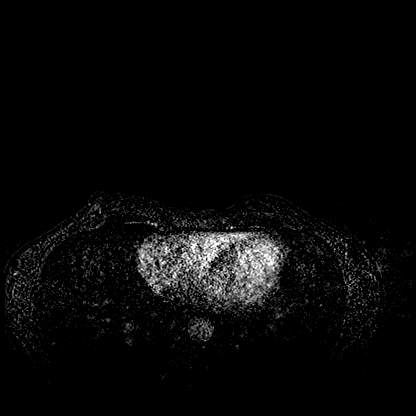
[im 56/112]
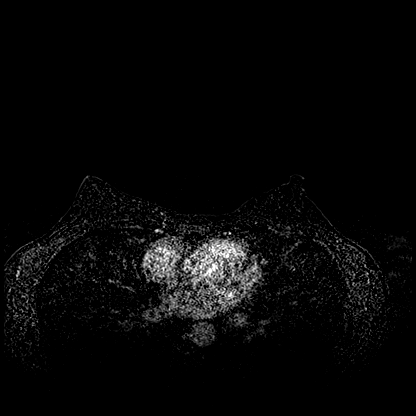
[im 84/112]
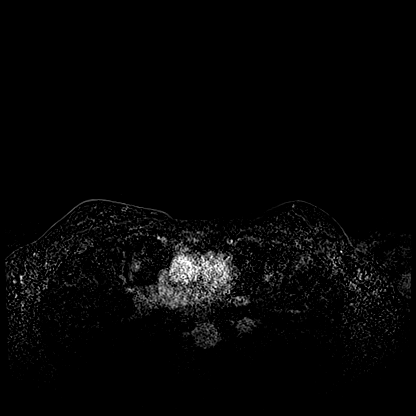
[im 112/112]
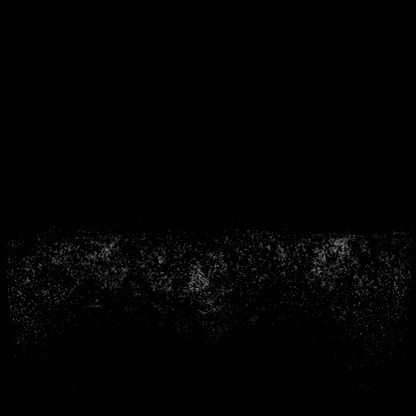

[Series 10: T1 fat-sat · axial · 1.2mm · 0.79mm/px · 1 of 112 slices shown (4 of 4)]
[im 1/112]
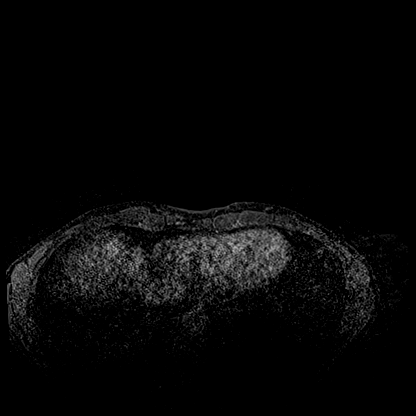

[29 of 48 positions shown; findings below may reference images not displayed]

Three-dimensional MR images were rendered by post-processing of the
original MR data on an independent workstation. The
three-dimensional MR images were interpreted, and findings are
reported in the following complete MRI report for this study. Three
dimensional images were evaluated at the independent interpreting
workstation using the DynaCAD thin client.
FINDINGS: Breast composition: c. Heterogeneous fibroglandular tissue.

Background parenchymal enhancement: Minimal

Right breast: No mass or abnormal enhancement.

Left breast: Within the LOWER OUTER QUADRANT of the LEFT breast,
there is focal non mass enhancement spanning 1.2 x 1.2 centimeters
(image 62 of series 13). Enhancement shows delayed, persistent
kinetics. There is no correlate for this finding on noncontrast
images.

Lymph nodes: No abnormal appearing lymph nodes.

Ancillary findings:  None.
IMPRESSION: Focal non mass enhancement in the LOWER OUTER QUADRANT of the LEFT
breast warranting tissue diagnosis.

RIGHT breast is negative.

RECOMMENDATION:
Recommend MR guided core biopsy of LEFT breast.

BI-RADS CATEGORY  4: Suspicious.

## 2021-08-05 MED ORDER — GADOBUTROL 1 MMOL/ML IV SOLN
6.0000 mL | Freq: Once | INTRAVENOUS | Status: AC | PRN
  Administered 2021-08-05: 6 mL via INTRAVENOUS

## 2021-08-05 MED ORDER — GADOBUTROL 1 MMOL/ML IV SOLN: 6.0000 mL | Freq: Once | INTRAVENOUS | Status: DC | PRN

## 2021-08-07 ENCOUNTER — Other Ambulatory Visit: Payer: Self-pay | Admitting: Obstetrics and Gynecology

## 2021-08-07 DIAGNOSIS — R9389 Abnormal findings on diagnostic imaging of other specified body structures: Secondary | ICD-10-CM

## 2021-08-09 ENCOUNTER — Ambulatory Visit
Admission: RE | Admit: 2021-08-09 | Discharge: 2021-08-09 | Disposition: A | Discharge status: Home / Self Care | Payer: No Typology Code available for payment source | Source: Ambulatory Visit | Attending: Obstetrics and Gynecology | Admitting: Obstetrics and Gynecology

## 2021-08-09 ENCOUNTER — Other Ambulatory Visit: Payer: Self-pay

## 2021-08-09 ENCOUNTER — Other Ambulatory Visit: Payer: Self-pay | Admitting: Obstetrics and Gynecology

## 2021-08-09 ENCOUNTER — Other Ambulatory Visit (HOSPITAL_COMMUNITY): Payer: Self-pay | Admitting: Diagnostic Radiology

## 2021-08-09 DIAGNOSIS — R9389 Abnormal findings on diagnostic imaging of other specified body structures: Secondary | ICD-10-CM

## 2021-08-09 IMAGING — MG MM BREAST LOCALIZATION CLIP
4 series · 4 of 12 positions shown · non-contrast
Comparison: Previous exam(s).

CLINICAL DATA: Status post MR guided core biopsy of 2 sites in the
LEFT breast.

EXAM:
3D DIAGNOSTIC LEFT MAMMOGRAM POST MRI BIOPSY x2

[L ML synth-2D]
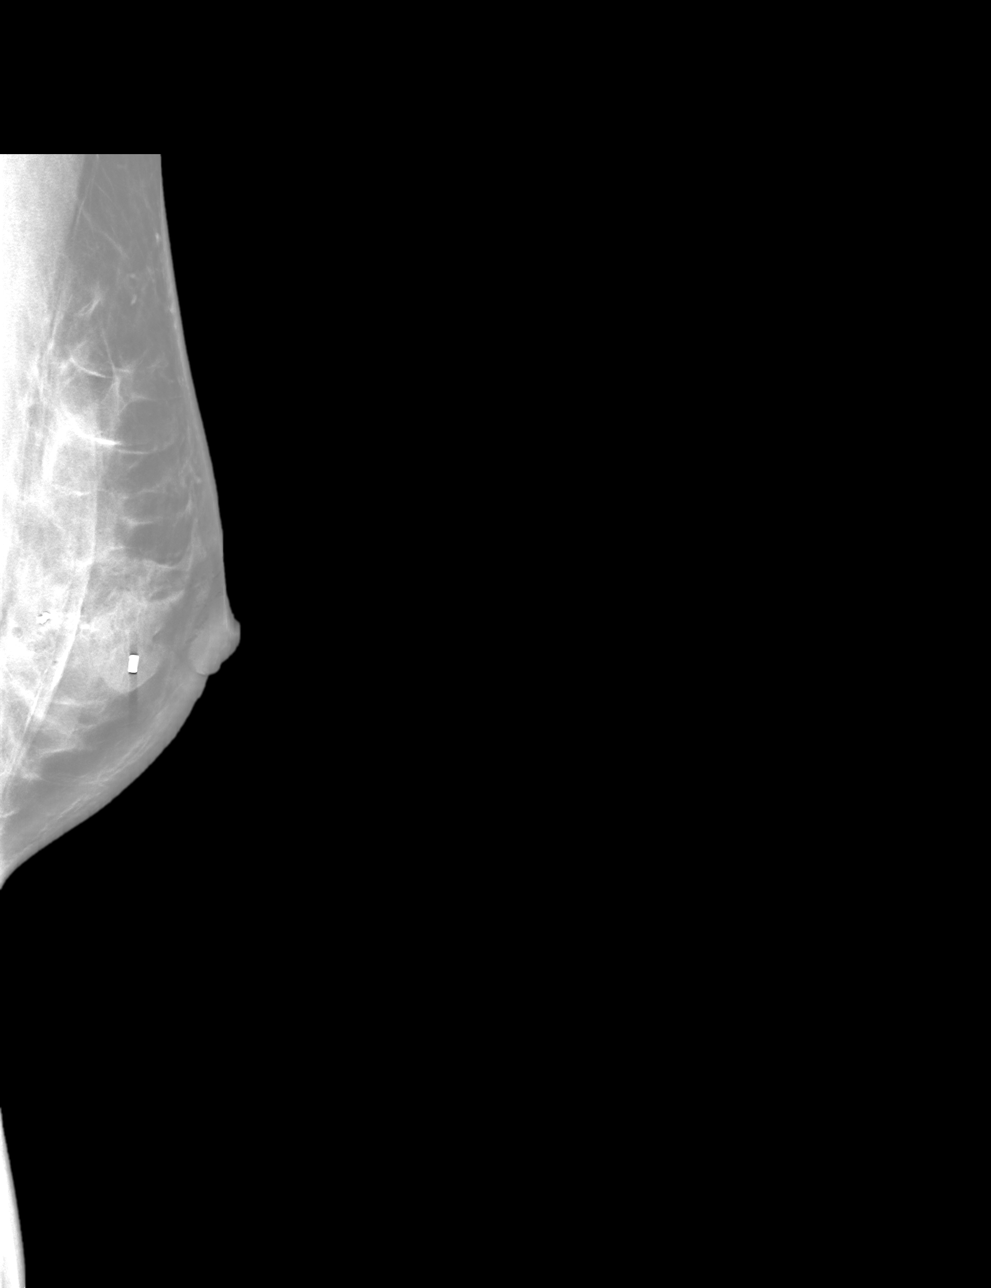

[L CC synth-2D]
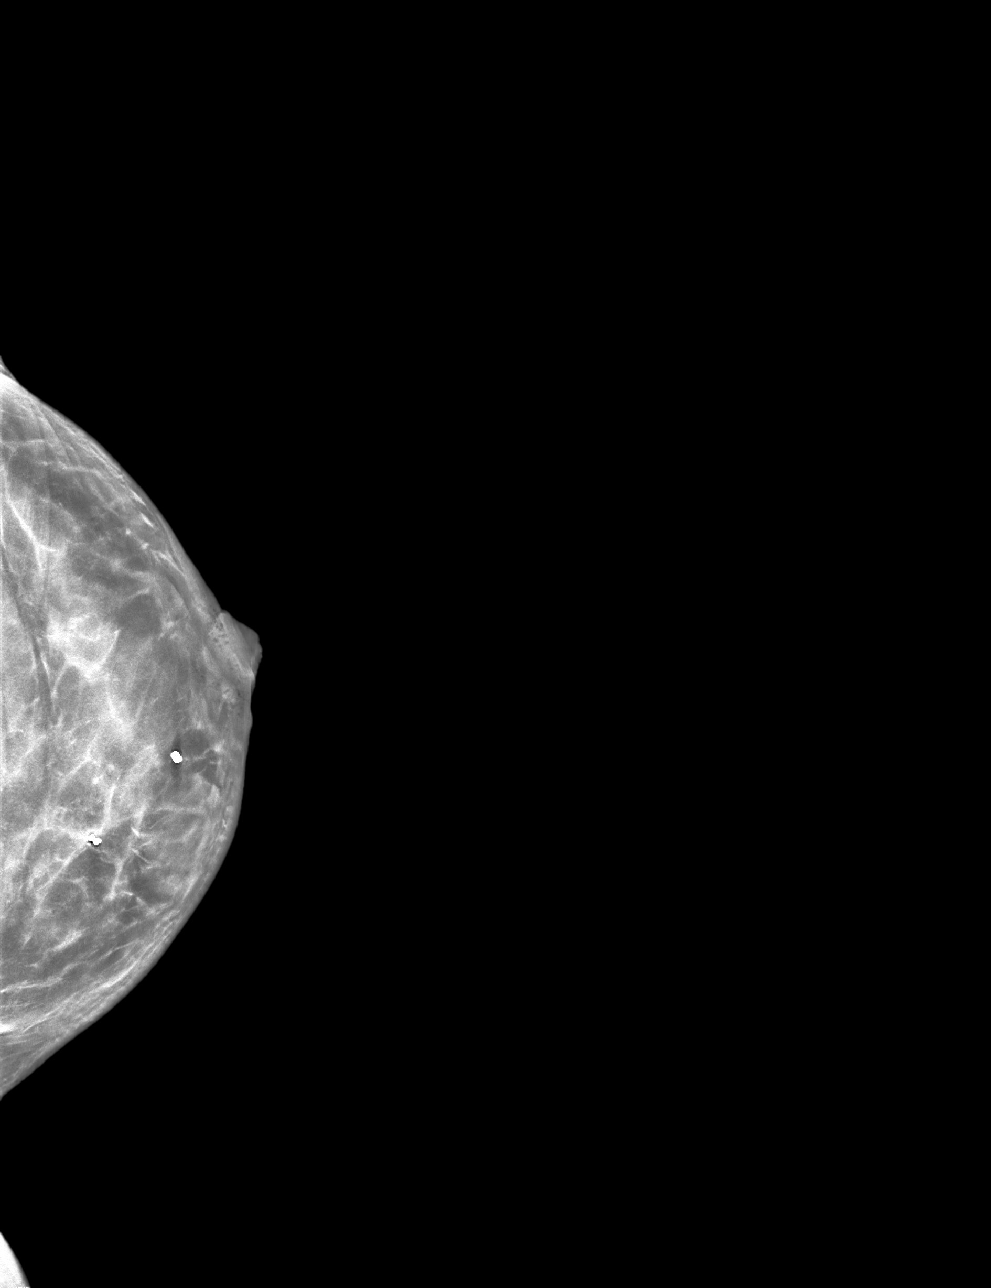

[L CC tomo · tomo slice 29/57.0]
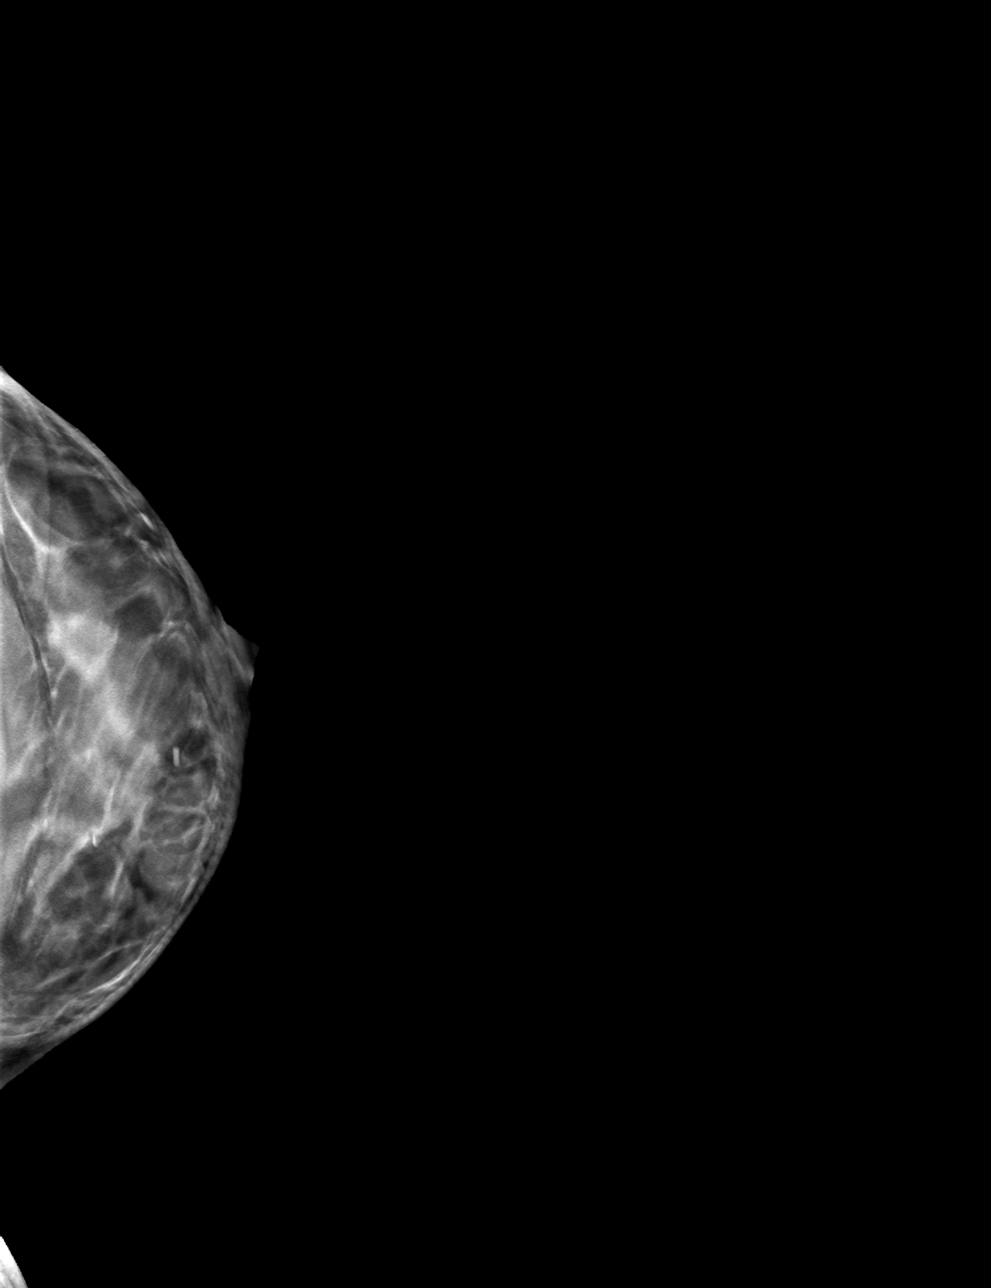

[L ML tomo · tomo slice 36/71.0]
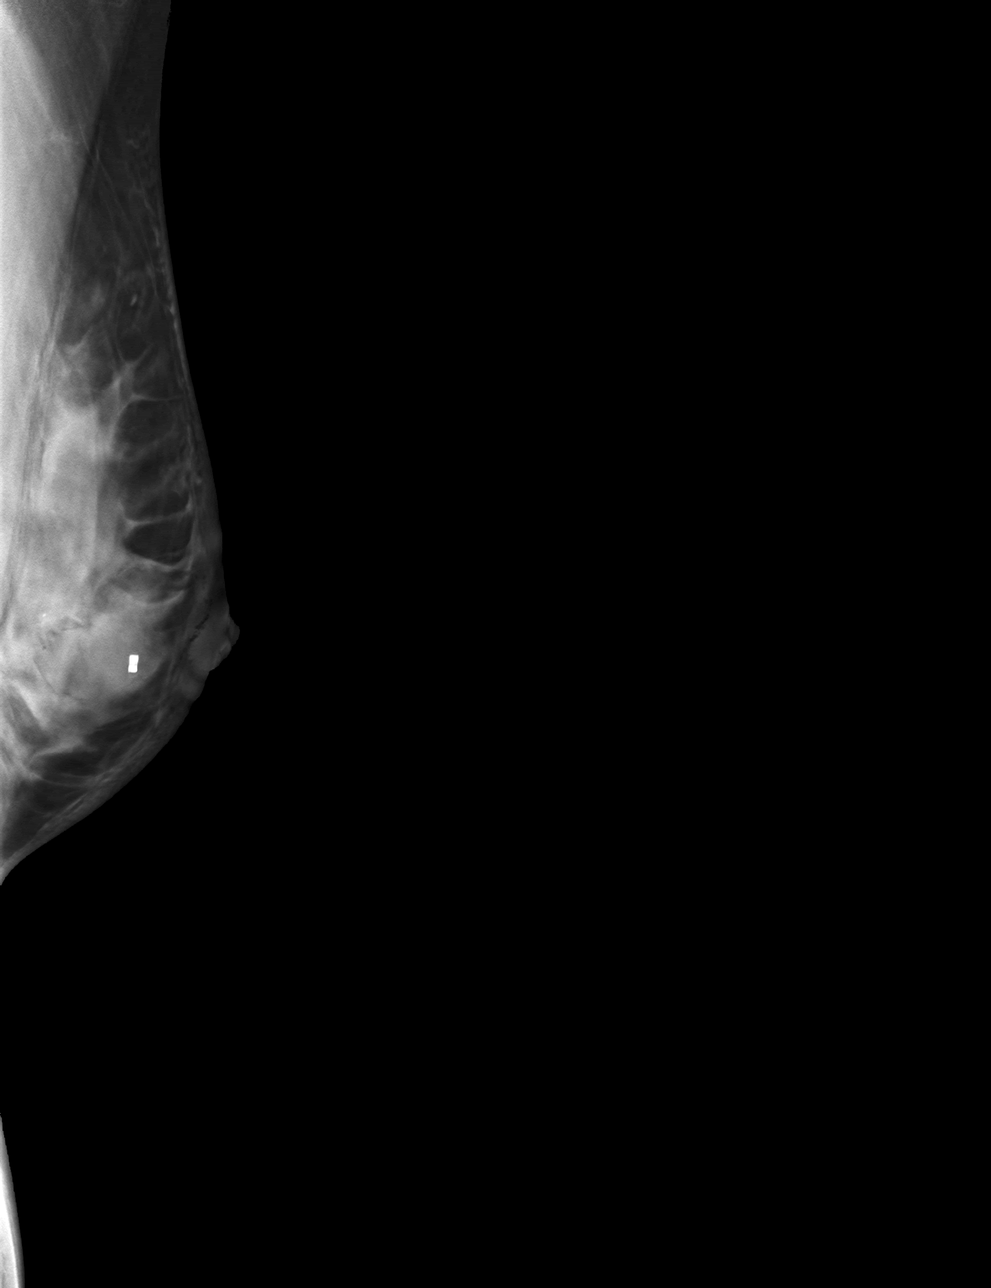

[4 of 12 positions shown; findings below may reference images not displayed]

FINDINGS: 3D Mammographic images were obtained following MRI guided biopsy of
non mass enhancement in the posterior LOWER OUTER QUADRANT of the
LEFT breast and placement of a barbell shaped clip. The biopsy
marking clip is in expected position at the site of biopsy.

Following biopsy of the 5 millimeter mass in the anterior LOWER
OUTER QUADRANT of the LEFT breast, a cylinder-shaped clip was placed
and is identified in the expected location.

Clips are 1.9 centimeters apart on the craniocaudal projection.
IMPRESSION: Tissue marker clips are in the expected locations after biopsy.

Final Assessment: Post Procedure Mammograms for Marker Placement

## 2021-08-09 IMAGING — MR MR BREAST BX W LOC DEV 1ST LESION IMAGE BX SPEC MR GUIDE*L*
8 of 12 series · 30 of 48 positions shown · IV contrast (4 ml gadavist)
Comparison: Previous exams.
COMPARISON: Previous exams.

Addendum:
CLINICAL DATA: Patient presents for MR guided core biopsy of the
LEFT breast.

EXAM:
MRI GUIDED CORE NEEDLE BIOPSY OF THE LEFT BREAST x2
TECHNIQUE: Multiplanar, multisequence MR imaging of the LEFT breast was
performed both before and after administration of intravenous
contrast.
CONTRAST:  4mL GADAVIST GADOBUTROL 1 MMOL/ML IV SOLN

[Series 2: fiducial unilateral · sagittal · 2.0mm · 1.33mm/px · 3 of 52 slices shown]
[im 1/52]
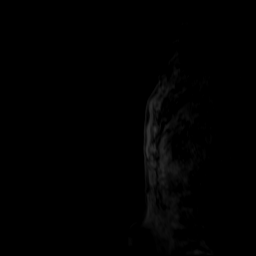
[im 26/52]
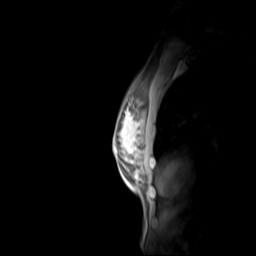
[im 52/52]
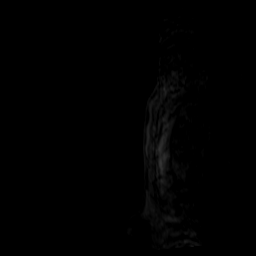

[Series 3: dynamic pre · axial · non-contrast · 1.3mm · 0.73mm/px · z∈[-98,+88]mm · 5 of 144 slices shown]
[im 1/144]
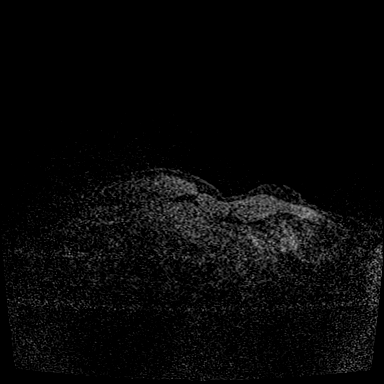
[im 36/144]
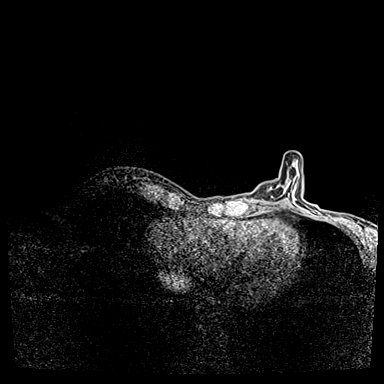
[im 72/144]
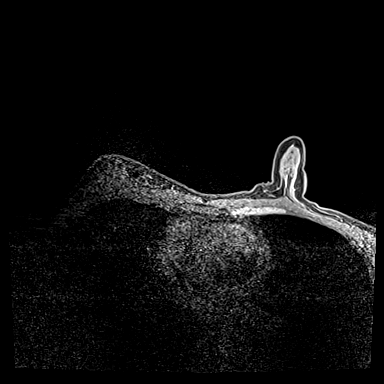
[im 108/144]
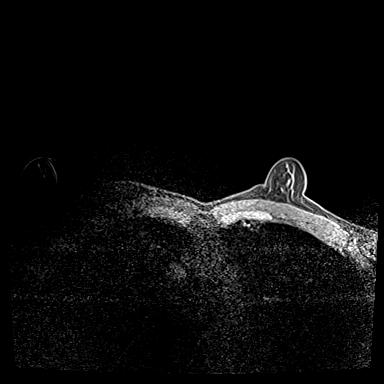
[im 144/144]
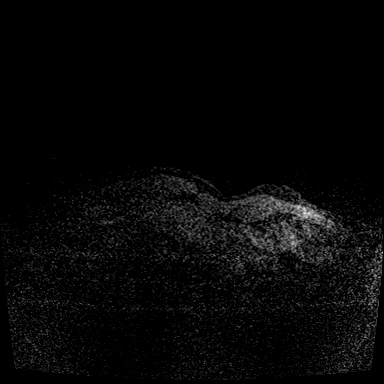

[Series 4: dynamic post 20 · axial · 1.3mm · 0.73mm/px · z∈[-98,+88]mm · 4 of 144 slices shown (1 of 2)]
[im 1/144]
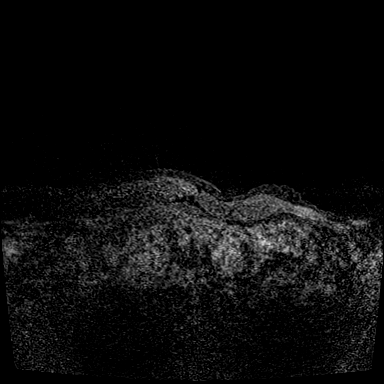
[im 48/144]
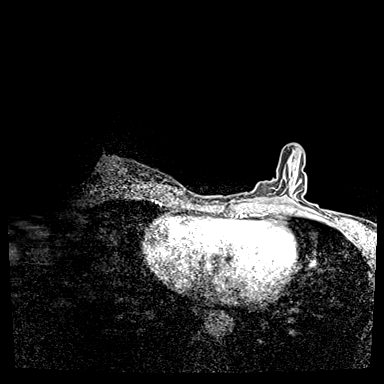
[im 96/144]
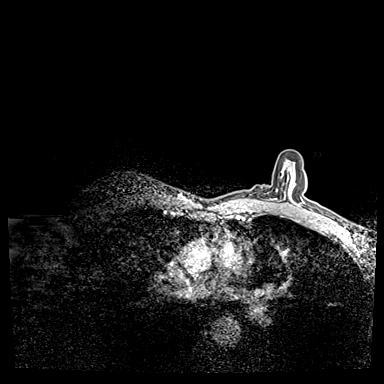
[im 144/144]
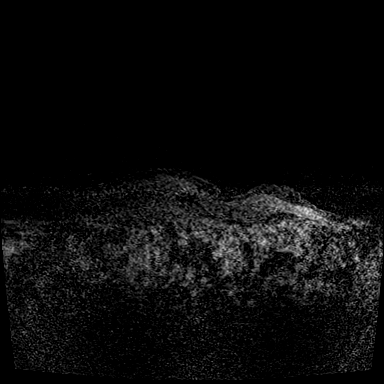

[Series 5: dynamic post 20 · axial · 1.3mm · 0.73mm/px · z∈[-98,+88]mm · 4 of 144 slices shown (2 of 2)]
[im 1/144]
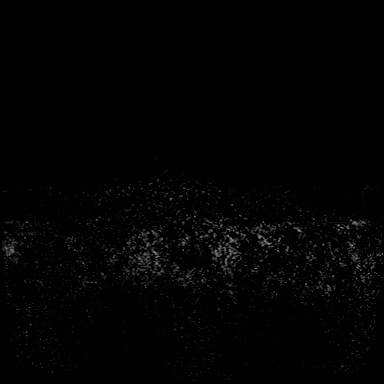
[im 48/144]
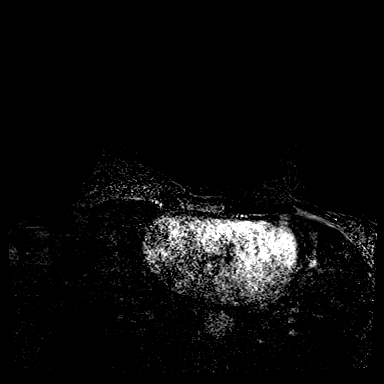
[im 96/144]
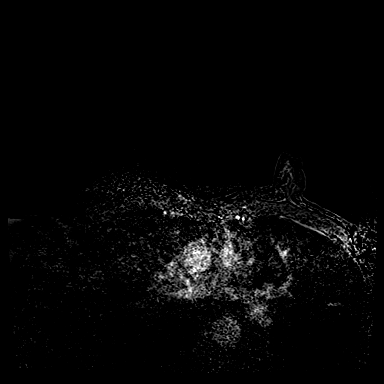
[im 144/144]
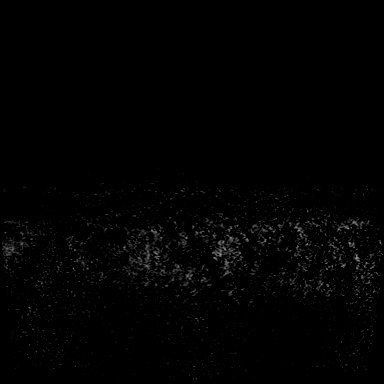

[Series 6: dynamic post 3 · axial · 1.3mm · 0.73mm/px · z∈[-98,+88]mm · 4 of 144 slices shown (1 of 2)]
[im 1/144]
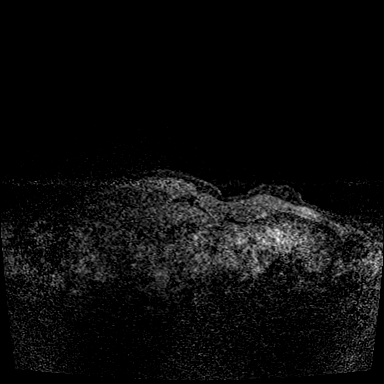
[im 48/144]
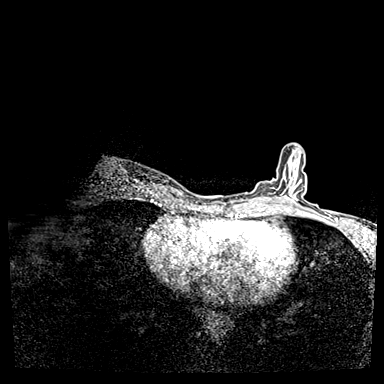
[im 96/144]
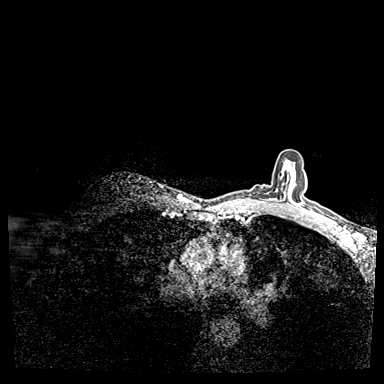
[im 144/144]
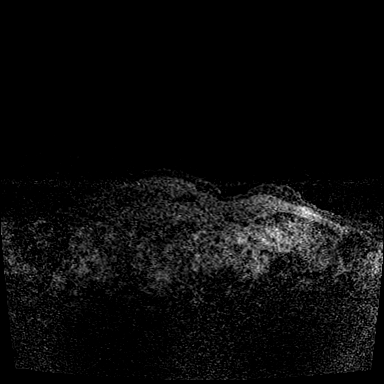

[Series 7: dynamic post 3 · axial · 1.3mm · 0.73mm/px · z∈[-98,+88]mm · 4 of 144 slices shown (2 of 2)]
[im 1/144]
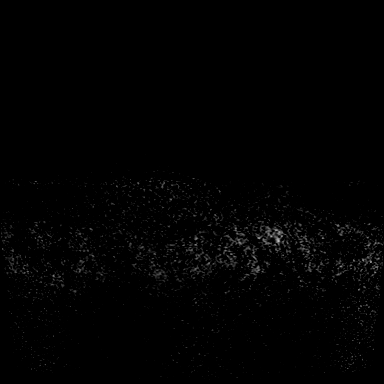
[im 48/144]
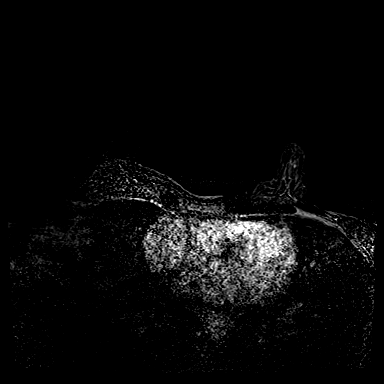
[im 96/144]
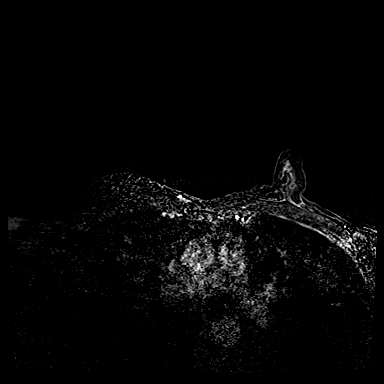
[im 144/144]
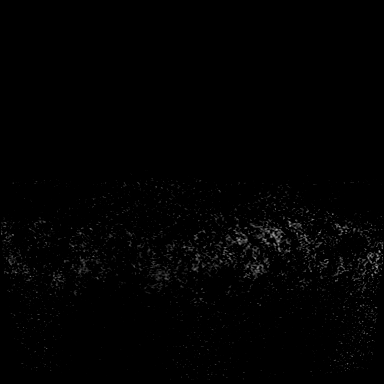

[Series 8: dynamic post 5min · axial · 1.3mm · 0.73mm/px · z∈[-98,+88]mm · 4 of 144 slices shown]
[im 1/144]
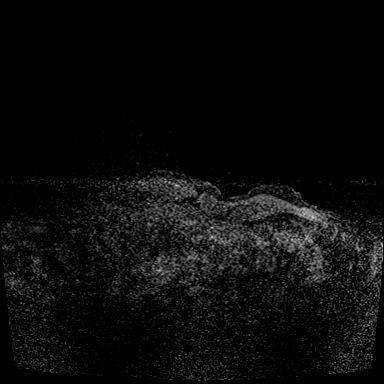
[im 48/144]
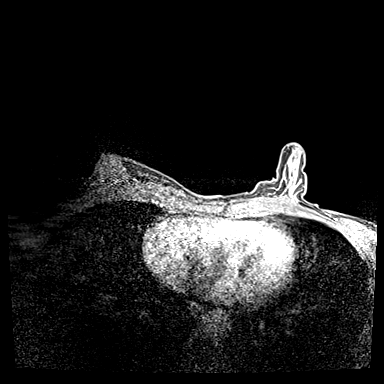
[im 96/144]
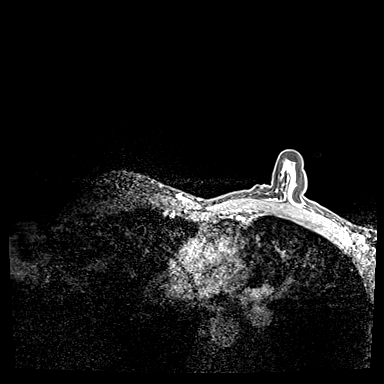
[im 144/144]
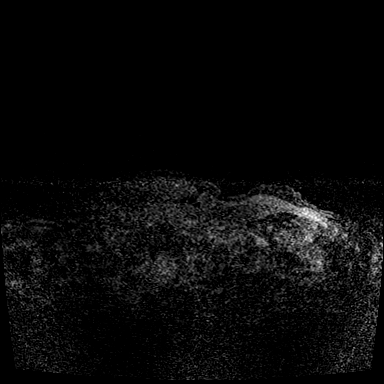

[Series 9: dynamic post 5min_sub · axial · 1.3mm · 0.73mm/px · z∈[-98,-37]mm · 2 of 144 slices shown]
[im 1/144]
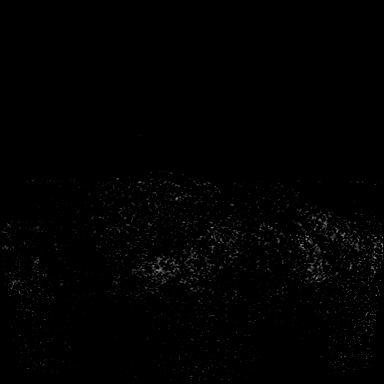
[im 48/144]
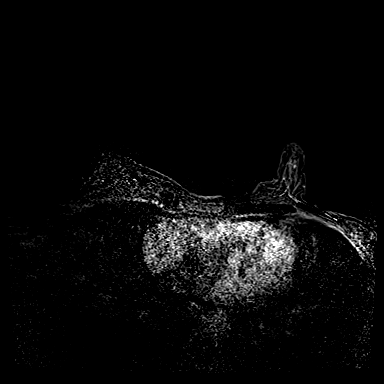

[30 of 48 positions shown; findings below may reference images not displayed]

FINDINGS: I met with the patient, and we discussed the procedure of MRI guided
biopsy, including risks, benefits, and alternatives. Specifically,
we discussed the risks of infection, bleeding, tissue injury, clip
migration, and inadequate sampling. Informed, written consent was
given. The usual time out protocol was performed immediately prior
to the procedure.

On scout imaging, a second mass is identified anterior to the non
mass enhancement in the LEFT breast. This mass measures 5
millimeters and is best seen on image 88 of series 9. Therefore,
after discussing with the patient, this biopsy was also performed.

Site 1: Lesion quadrant: Posterior LOWER OUTER QUADRANT LEFT breast,
barbell clip

Using sterile technique and 1% lidocaine and 1% lidocaine with
epinephrine as local anesthetic, under MRI guidance, a 9 gauge
vacuum assisted device was used to perform core needle biopsy of
area of non mass enhancement in the posterior LOWER OUTER QUADRANT
of the LEFT breast using a LATERAL approach.

At the conclusion of the procedure, barbell tissue marker clip was
deployed into the biopsy cavity.

Site 2: Lesion quadrant: Anterior LOWER OUTER QUADRANT LEFT breast,
cylinder clip

Using sterile technique and 1% lidocaine and 1% lidocaine with
epinephrine as local anesthetic, under MRI guidance, a 9 gauge
vacuum assisted device was used to perform core needle biopsy of 5
millimeter mass in the anterior LOWER OUTER QUADRANT of the LEFT
breast using a LATERAL approach.

At the conclusion of the procedure, cylinder tissue marker clip was
deployed into the biopsy cavity.

Follow-up LEFT 2-view mammogram was performed and dictated
separately.
IMPRESSION: MRI guided biopsy of 2 sites in the LEFT breast. No apparent
complications.

ADDENDUM:
Pathology revealed FIBROCYSTIC CHANGE of the LEFT breast, Non-mass
enhancement posterior LOQ, (barbell clip). This was found to be
concordant by Dr. AZUKILE.

Pathology revealed FIBROCYSTIC CHANGE of the LEFT breast, Enhancing
mass anterior lateral, (cylinder clip). This was found to be
concordant by Dr. AZUKILE.

Pathology results were discussed with the patient by telephone. The
patient reported doing well after the biopsies with tenderness,
bleeding and bruising at the sites. Post biopsy instructions and
care were reviewed and questions were answered. The patient was
encouraged to call The [REDACTED] for any
additional concerns. My direct phone number was provided.

The patient was instructed to return for a bilateral breast MRI in 6
months, per protocol, and to continue with annual mammography at

Pathology results reported by AZUKILE, RN on [DATE].

*** End of Addendum ***
FINDINGS: I met with the patient, and we discussed the procedure of MRI guided
biopsy, including risks, benefits, and alternatives. Specifically,
we discussed the risks of infection, bleeding, tissue injury, clip
migration, and inadequate sampling. Informed, written consent was
given. The usual time out protocol was performed immediately prior
to the procedure.

On scout imaging, a second mass is identified anterior to the non
mass enhancement in the LEFT breast. This mass measures 5
millimeters and is best seen on image 88 of series 9. Therefore,
after discussing with the patient, this biopsy was also performed.

Site 1: Lesion quadrant: Posterior LOWER OUTER QUADRANT LEFT breast,
barbell clip

Using sterile technique and 1% lidocaine and 1% lidocaine with
epinephrine as local anesthetic, under MRI guidance, a 9 gauge
vacuum assisted device was used to perform core needle biopsy of
area of non mass enhancement in the posterior LOWER OUTER QUADRANT
of the LEFT breast using a LATERAL approach.

At the conclusion of the procedure, barbell tissue marker clip was
deployed into the biopsy cavity.

Site 2: Lesion quadrant: Anterior LOWER OUTER QUADRANT LEFT breast,
cylinder clip

Using sterile technique and 1% lidocaine and 1% lidocaine with
epinephrine as local anesthetic, under MRI guidance, a 9 gauge
vacuum assisted device was used to perform core needle biopsy of 5
millimeter mass in the anterior LOWER OUTER QUADRANT of the LEFT
breast using a LATERAL approach.

At the conclusion of the procedure, cylinder tissue marker clip was
deployed into the biopsy cavity.

Follow-up LEFT 2-view mammogram was performed and dictated
separately.
IMPRESSION: MRI guided biopsy of 2 sites in the LEFT breast. No apparent
complications.

## 2021-08-09 MED ORDER — GADOBUTROL 1 MMOL/ML IV SOLN
4.0000 mL | Freq: Once | INTRAVENOUS | Status: AC | PRN
  Administered 2021-08-09: 4 mL via INTRAVENOUS

## 2021-08-22 ENCOUNTER — Other Ambulatory Visit (HOSPITAL_BASED_OUTPATIENT_CLINIC_OR_DEPARTMENT_OTHER): Payer: Self-pay

## 2021-08-22 MED ORDER — LEVONORGESTREL-ETHINYL ESTRAD 0.15-30 MG-MCG PO TABS
1.0000 | ORAL_TABLET | Freq: Every day | ORAL | 4 refills | Status: DC
  Filled 2021-08-22: qty 84, 84d supply, fill #0
  Filled 2021-11-20: qty 84, 84d supply, fill #1
  Filled 2022-02-11: qty 84, 84d supply, fill #2
  Filled 2022-05-09: qty 84, 84d supply, fill #3
  Filled 2022-07-29: qty 84, 84d supply, fill #4

## 2021-11-20 ENCOUNTER — Other Ambulatory Visit (HOSPITAL_BASED_OUTPATIENT_CLINIC_OR_DEPARTMENT_OTHER): Payer: Self-pay

## 2022-01-31 ENCOUNTER — Encounter: Payer: Self-pay | Admitting: Internal Medicine

## 2022-02-12 ENCOUNTER — Other Ambulatory Visit (HOSPITAL_BASED_OUTPATIENT_CLINIC_OR_DEPARTMENT_OTHER): Payer: Self-pay

## 2022-03-06 ENCOUNTER — Encounter: Payer: Self-pay | Admitting: Internal Medicine

## 2022-03-13 LAB — HM MAMMOGRAPHY

## 2022-05-11 ENCOUNTER — Other Ambulatory Visit (HOSPITAL_BASED_OUTPATIENT_CLINIC_OR_DEPARTMENT_OTHER): Payer: Self-pay

## 2022-07-29 ENCOUNTER — Other Ambulatory Visit (HOSPITAL_BASED_OUTPATIENT_CLINIC_OR_DEPARTMENT_OTHER): Payer: Self-pay

## 2022-08-03 ENCOUNTER — Other Ambulatory Visit (HOSPITAL_BASED_OUTPATIENT_CLINIC_OR_DEPARTMENT_OTHER): Payer: Self-pay

## 2022-08-03 DIAGNOSIS — Z9189 Other specified personal risk factors, not elsewhere classified: Secondary | ICD-10-CM | POA: Diagnosis not present

## 2022-08-03 DIAGNOSIS — Z6821 Body mass index (BMI) 21.0-21.9, adult: Secondary | ICD-10-CM | POA: Diagnosis not present

## 2022-08-03 DIAGNOSIS — Z3040 Encounter for surveillance of contraceptives, unspecified: Secondary | ICD-10-CM | POA: Diagnosis not present

## 2022-08-03 DIAGNOSIS — Z803 Family history of malignant neoplasm of breast: Secondary | ICD-10-CM | POA: Diagnosis not present

## 2022-08-03 DIAGNOSIS — Z01419 Encounter for gynecological examination (general) (routine) without abnormal findings: Secondary | ICD-10-CM | POA: Diagnosis not present

## 2022-08-03 MED ORDER — LEVONORGESTREL-ETHINYL ESTRAD 0.15-30 MG-MCG PO TABS
1.0000 | ORAL_TABLET | Freq: Every day | ORAL | 4 refills | Status: DC
  Filled 2022-08-03 – 2022-10-12 (×2): qty 84, 84d supply, fill #0
  Filled 2022-12-11 (×2): qty 28, 28d supply, fill #1
  Filled 2023-01-08: qty 28, 28d supply, fill #2
  Filled 2023-02-07: qty 28, 28d supply, fill #3
  Filled 2023-03-04: qty 28, 28d supply, fill #4
  Filled 2023-04-01: qty 28, 28d supply, fill #5
  Filled 2023-05-03 – 2023-05-04 (×2): qty 28, 28d supply, fill #6
  Filled 2023-05-27: qty 28, 28d supply, fill #7
  Filled 2023-06-24: qty 28, 28d supply, fill #8
  Filled 2023-07-22: qty 28, 28d supply, fill #9

## 2022-08-06 ENCOUNTER — Other Ambulatory Visit: Payer: Self-pay | Admitting: Family

## 2022-08-06 DIAGNOSIS — Z9189 Other specified personal risk factors, not elsewhere classified: Secondary | ICD-10-CM

## 2022-09-07 ENCOUNTER — Ambulatory Visit
Admission: RE | Admit: 2022-09-07 | Discharge: 2022-09-07 | Disposition: A | Discharge status: Home / Self Care | Payer: 59 | Source: Ambulatory Visit | Attending: Family | Admitting: Family

## 2022-09-07 DIAGNOSIS — Z1239 Encounter for other screening for malignant neoplasm of breast: Secondary | ICD-10-CM | POA: Diagnosis not present

## 2022-09-07 DIAGNOSIS — Z9189 Other specified personal risk factors, not elsewhere classified: Secondary | ICD-10-CM

## 2022-09-07 MED ORDER — GADOPICLENOL 0.5 MMOL/ML IV SOLN
6.0000 mL | Freq: Once | INTRAVENOUS | Status: AC | PRN
  Administered 2022-09-07: 6 mL via INTRAVENOUS

## 2022-09-10 ENCOUNTER — Other Ambulatory Visit: Payer: Self-pay | Admitting: Family

## 2022-09-10 DIAGNOSIS — R9389 Abnormal findings on diagnostic imaging of other specified body structures: Secondary | ICD-10-CM

## 2022-09-19 ENCOUNTER — Ambulatory Visit
Admission: RE | Admit: 2022-09-19 | Discharge: 2022-09-19 | Disposition: A | Discharge status: Home / Self Care | Payer: 59 | Source: Ambulatory Visit | Attending: Family | Admitting: Family

## 2022-09-19 DIAGNOSIS — R9389 Abnormal findings on diagnostic imaging of other specified body structures: Secondary | ICD-10-CM

## 2022-09-19 DIAGNOSIS — R928 Other abnormal and inconclusive findings on diagnostic imaging of breast: Secondary | ICD-10-CM | POA: Diagnosis not present

## 2022-09-19 MED ORDER — GADOPICLENOL 0.5 MMOL/ML IV SOLN: 6.0000 mL | Freq: Once | INTRAVENOUS | Status: DC | PRN

## 2022-10-12 ENCOUNTER — Other Ambulatory Visit (HOSPITAL_BASED_OUTPATIENT_CLINIC_OR_DEPARTMENT_OTHER): Payer: Self-pay

## 2022-10-31 ENCOUNTER — Other Ambulatory Visit: Payer: Managed Care, Other (non HMO)

## 2022-10-31 ENCOUNTER — Telehealth: Payer: Self-pay | Admitting: *Deleted

## 2022-10-31 DIAGNOSIS — Z0000 Encounter for general adult medical examination without abnormal findings: Secondary | ICD-10-CM

## 2022-10-31 LAB — CBC WITH DIFFERENTIAL/PLATELET
Basos: 1 %
Eos: 1 %
Lymphs: 33 %
MCV: 96 fL (ref 79–97)
Monocytes Absolute: 0.4 10*3/uL (ref 0.1–0.9)

## 2022-10-31 LAB — COMPREHENSIVE METABOLIC PANEL

## 2022-10-31 LAB — LIPID PANEL

## 2022-10-31 NOTE — Telephone Encounter (Signed)
Patient came in for fasting labs for her CPE next week. Was scheduled in March for CPE and nurse visit at same time. Needs orders, thanks.

## 2022-10-31 NOTE — Telephone Encounter (Signed)
Orders were entered. Looks like this lab visit was scheduled the same day her "fasting cpe" was scheduled for 6/10.  If they are having a fasting CPE, they don't need labs prior (they get them drawn at their visit, when they come fasting).  If they are having labs scheduled to be done prior, they need to have orders, and the provider needs to be asked before scheduling.if no orders are present.  I haven't seen this person since 12/2020, who has no chronic issues or meds. This lab visit was inappropriately scheduled. This would have been better that she had blood drawn the morning of her visit if she couldn't fast until her visit, fast for the visit, or return for fasting labs a later date. This did not need labs done prior. And without having seen her, I have no idea if she has issues, concerns, or reasons for me to be doing any additional labs other than the routine orders that I put in for today.

## 2022-11-01 LAB — COMPREHENSIVE METABOLIC PANEL
Albumin/Globulin Ratio: 1.8 (ref 1.2–2.2)
Albumin: 4.6 g/dL (ref 3.9–4.9)
Alkaline Phosphatase: 61 IU/L (ref 44–121)
BUN/Creatinine Ratio: 14 (ref 9–23)
BUN: 13 mg/dL (ref 6–24)
Bilirubin Total: 0.4 mg/dL (ref 0.0–1.2)
CO2: 25 mmol/L (ref 20–29)
Calcium: 9.8 mg/dL (ref 8.7–10.2)
Creatinine, Ser: 0.96 mg/dL (ref 0.57–1.00)
Globulin, Total: 2.6 g/dL (ref 1.5–4.5)
Potassium: 4.3 mmol/L (ref 3.5–5.2)
Sodium: 140 mmol/L (ref 134–144)
Total Protein: 7.2 g/dL (ref 6.0–8.5)

## 2022-11-01 LAB — CBC WITH DIFFERENTIAL/PLATELET
Basophils Absolute: 0 10*3/uL (ref 0.0–0.2)
EOS (ABSOLUTE): 0.1 10*3/uL (ref 0.0–0.4)
Hematocrit: 43.9 % (ref 34.0–46.6)
Hemoglobin: 15.2 g/dL (ref 11.1–15.9)
Immature Grans (Abs): 0 10*3/uL (ref 0.0–0.1)
Immature Granulocytes: 1 %
Lymphocytes Absolute: 2.1 10*3/uL (ref 0.7–3.1)
MCH: 33.3 pg — ABNORMAL HIGH (ref 26.6–33.0)
MCHC: 34.6 g/dL (ref 31.5–35.7)
Monocytes: 6 %
Neutrophils Absolute: 3.7 10*3/uL (ref 1.4–7.0)
Neutrophils: 58 %
Platelets: 264 10*3/uL (ref 150–450)
RBC: 4.56 x10E6/uL (ref 3.77–5.28)
RDW: 12.2 % (ref 11.7–15.4)
WBC: 6.3 10*3/uL (ref 3.4–10.8)

## 2022-11-01 LAB — LIPID PANEL
Chol/HDL Ratio: 4.1 ratio (ref 0.0–4.4)
Cholesterol, Total: 212 mg/dL — ABNORMAL HIGH (ref 100–199)
HDL: 52 mg/dL (ref >39)
LDL Chol Calc (NIH): 143 mg/dL — ABNORMAL HIGH (ref 0–99)

## 2022-11-04 NOTE — Patient Instructions (Incomplete)

## 2022-11-04 NOTE — Progress Notes (Unsigned)
No chief complaint on file.   Maria Salinas is a 42 y.o. female who presents for a complete physical.  She sees GYN regularly.   She had bilateral MRI 08/2022: IMPRESSION: 1. Indeterminate asymmetric non masslike enhancement throughout the central, outer and lower RIGHT breast, spanning a maximal diameter of 5 cm. Two site MR guided RIGHT breast biopsy recommended. 2. No abnormal appearing lymph nodes. 3. No MR evidence of LEFT breast malignancy.  She underwent 2 biopsies of the R breast on 09/19/22: Diagnosis 1. Breast, right, needle core biopsy, upper central, cylinder shaped clip BENIGN BREAST TISSUE WITH A FEW MILDLY DILATED DUCTS. NEGATIVE FOR MALIGNANCY. 2. Breast, right, needle core biopsy, lower central, hourglass shaped clip BENIGN BREAST TISSUE WITH A FEW MILDLY DILATED DUCTS. NEGATIVE FOR MALIGNANCY.   Immunization History  Administered Date(s) Administered   Hepatitis B 03/06/1994, 04/10/1994, 09/18/1994   Influenza Split 03/03/2012   Influenza,inj,Quad PF,6+ Mos 03/11/2017, 04/07/2019   MMR 05/28/1997   PFIZER(Purple Top)SARS-COV-2 Vaccination 08/14/2019, 09/08/2019   Td 05/29/2011   Tdap 05/29/2011, 04/16/2018  Had COVID 05/2020 Last Pap smear: 07/2020 Dr. Renaldo Fiddler. Last GYN visit 07/2022 Last mammogram: 01/2020; MRI 08/2022, abnormal, benign biopsies of R breast x 2 Last colonoscopy: never Last DEXA: never Dentist: twice yearly Ophtho: once 10 years ago Exercise:  active at work and with her kids. This summer--not working--30 min walks, tennis, basketball.  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 Hasn't gone to the gym since COVID (used to go to [employer], classes)  Vitamin D-OH 49.5 in 12/2020 Thyroid screen: Lab Results  Component Value Date   TSH 0.78 09/09/2015    PMH, PSH, SH and FH were reviewed and updated   ROS: The patient denies anorexia, fever, weight changes, headaches,  vision changes, decreased hearing, ear pain, sore throat, breast  concerns, chest pain, palpitations, dizziness, syncope, dyspnea on exertion, cough, swelling, nausea, vomiting, diarrhea, constipation, abdominal pain, melena, hematochezia, indigestion/heartburn, hematuria, incontinence, dysuria, irregular menstrual cycles, vaginal discharge, odor or itch, genital lesions, joint pains, numbness, tingling, weakness, tremor, suspicious skin lesions, depression, anxiety, abnormal bleeding/bruising, or enlarged lymph nodes.   PHYSICAL EXAM:  There were no vitals taken for this visit.  Wt Readings from Last 3 Encounters:  01/02/21 122 lb 6.4 oz (55.5 kg)  07/28/20 122 lb (55.3 kg)  04/07/19 114 lb (51.7 kg)   General Appearance:    Alert, cooperative, no distress, appears stated age  Head:    Normocephalic, without obvious abnormality, atraumatic  Eyes:    PERRL, conjunctiva/corneas clear, EOM's intact, fundi benign  Ears:    Normal TM's and external ear canals  Nose:   No drainage or sinus tenderness  Throat:   Normal mucosa, dentition  Neck:   Supple, no lymphadenopathy;  thyroid:  no enlargement/ tenderness/nodules; no carotid bruit or JVD  Back:    Spine nontender, no curvature, ROM normal, no CVA tenderness  Lungs:     Clear to auscultation bilaterally without wheezes, rales or ronchi; respirations unlabored  Chest Wall:    No tenderness or deformity   Heart:    Regular rate and rhythm, S1 and S2 normal, no murmur, rub or gallop  Breast Exam:    Deferred to GYN  Abdomen:     Soft, non-tender, nondistended, normoactive bowel sounds, no masses, no hepatosplenomegaly  Genitalia:    Deferred to GYN     Extremities:   No clubbing, cyanosis or edema  Pulses:   2+ and symmetric  all extremities  Skin:   Skin color, texture, turgor normal, no rashes or lesions  Lymph nodes:   Cervical, supraclavicular, and axillary nodes normal  Neurologic:   Normal strength, sensation and gait; reflexes 2+ and symmetric throughout                       Psych:   Normal mood,  affect, hygiene and grooming.     Lab Results  Component Value Date   WBC 6.3 10/31/2022   HGB 15.2 10/31/2022   HCT 43.9 10/31/2022   MCV 96 10/31/2022   PLT 264 10/31/2022   Lab Results  Component Value Date   CHOL 212 (H) 10/31/2022   HDL 52 10/31/2022   LDLCALC 143 (H) 10/31/2022   TRIG 96 10/31/2022   CHOLHDL 4.1 10/31/2022   Fasting glu 81    Chemistry      Component Value Date/Time   NA 140 10/31/2022 0825   K 4.3 10/31/2022 0825   CL 104 10/31/2022 0825   CO2 25 10/31/2022 0825   BUN 13 10/31/2022 0825   CREATININE 0.96 10/31/2022 0825   CREATININE 1.02 03/31/2019 0812      Component Value Date/Time   CALCIUM 9.8 10/31/2022 0825   ALKPHOS 61 10/31/2022 0825   AST 19 10/31/2022 0825   ALT 16 10/31/2022 0825   BILITOT 0.4 10/31/2022 0825       ASSESSMENT/PLAN:  Get records from Dr. Renaldo Fiddler, seen 07/2022 Document flu shots. Any COVID booster?    Recommended at least 30 minutes of aerobic activity at least 5 days/week, weight-bearing exercise at least 2x/week; proper sunscreen use reviewed; healthy diet, including goals of calcium and vitamin D intake and alcohol recommendations (less than or equal to 1 drink/day) reviewed; regular seatbelt use; changing batteries in smoke detectors.  Immunization recommendations discussed, yearly flu shots recommended.  Discussed COVID boosters.  Colonoscopy recommendations reviewed, age 26.  CPE at 42 if seeing GYN, o/w yearly here.

## 2022-11-05 ENCOUNTER — Ambulatory Visit (INDEPENDENT_AMBULATORY_CARE_PROVIDER_SITE_OTHER): Payer: Managed Care, Other (non HMO) | Admitting: Family Medicine

## 2022-11-05 ENCOUNTER — Encounter: Payer: Self-pay | Admitting: Family Medicine

## 2022-11-05 VITALS — BP 120/80 | HR 84 | Ht 64.5 in | Wt 122.8 lb

## 2022-11-05 DIAGNOSIS — F419 Anxiety disorder, unspecified: Secondary | ICD-10-CM | POA: Diagnosis not present

## 2022-11-05 DIAGNOSIS — E785 Hyperlipidemia, unspecified: Secondary | ICD-10-CM | POA: Diagnosis not present

## 2022-11-05 DIAGNOSIS — Z0000 Encounter for general adult medical examination without abnormal findings: Secondary | ICD-10-CM | POA: Diagnosis not present

## 2022-11-05 LAB — POCT URINALYSIS DIP (PROADVANTAGE DEVICE)
Bilirubin, UA: NEGATIVE
Blood, UA: NEGATIVE
Glucose, UA: NEGATIVE mg/dL
Ketones, POC UA: NEGATIVE mg/dL
Leukocytes, UA: NEGATIVE
Nitrite, UA: NEGATIVE
Protein Ur, POC: NEGATIVE mg/dL
Specific Gravity, Urine: 1.02
Urobilinogen, Ur: 0.2
pH, UA: 6 (ref 5.0–8.0)

## 2022-11-07 ENCOUNTER — Encounter: Payer: Self-pay | Admitting: *Deleted

## 2022-11-13 ENCOUNTER — Encounter: Payer: Self-pay | Admitting: *Deleted

## 2022-12-10 ENCOUNTER — Encounter (HOSPITAL_BASED_OUTPATIENT_CLINIC_OR_DEPARTMENT_OTHER): Payer: Self-pay

## 2022-12-11 ENCOUNTER — Other Ambulatory Visit (HOSPITAL_BASED_OUTPATIENT_CLINIC_OR_DEPARTMENT_OTHER): Payer: Self-pay

## 2023-02-08 ENCOUNTER — Other Ambulatory Visit (HOSPITAL_BASED_OUTPATIENT_CLINIC_OR_DEPARTMENT_OTHER): Payer: Self-pay

## 2023-02-18 ENCOUNTER — Other Ambulatory Visit: Payer: Self-pay | Admitting: Family

## 2023-02-18 DIAGNOSIS — Z803 Family history of malignant neoplasm of breast: Secondary | ICD-10-CM

## 2023-03-29 ENCOUNTER — Ambulatory Visit
Admission: RE | Admit: 2023-03-29 | Discharge: 2023-03-29 | Disposition: A | Discharge status: Home / Self Care | Payer: Managed Care, Other (non HMO) | Source: Ambulatory Visit | Attending: Family | Admitting: Family

## 2023-03-29 DIAGNOSIS — Z803 Family history of malignant neoplasm of breast: Secondary | ICD-10-CM

## 2023-03-29 MED ORDER — GADOPICLENOL 0.5 MMOL/ML IV SOLN
5.0000 mL | Freq: Once | INTRAVENOUS | Status: AC | PRN
  Administered 2023-03-29: 5 mL via INTRAVENOUS

## 2023-05-04 ENCOUNTER — Other Ambulatory Visit (HOSPITAL_BASED_OUTPATIENT_CLINIC_OR_DEPARTMENT_OTHER): Payer: Self-pay

## 2023-05-28 ENCOUNTER — Other Ambulatory Visit (HOSPITAL_BASED_OUTPATIENT_CLINIC_OR_DEPARTMENT_OTHER): Payer: Self-pay

## 2023-05-28 ENCOUNTER — Other Ambulatory Visit: Payer: Self-pay

## 2023-05-31 ENCOUNTER — Other Ambulatory Visit (HOSPITAL_BASED_OUTPATIENT_CLINIC_OR_DEPARTMENT_OTHER): Payer: Self-pay

## 2023-06-24 ENCOUNTER — Other Ambulatory Visit (HOSPITAL_BASED_OUTPATIENT_CLINIC_OR_DEPARTMENT_OTHER): Payer: Self-pay

## 2023-08-02 ENCOUNTER — Other Ambulatory Visit: Payer: Self-pay | Admitting: Obstetrics and Gynecology

## 2023-08-02 DIAGNOSIS — Z9189 Other specified personal risk factors, not elsewhere classified: Secondary | ICD-10-CM

## 2023-08-16 ENCOUNTER — Other Ambulatory Visit (HOSPITAL_BASED_OUTPATIENT_CLINIC_OR_DEPARTMENT_OTHER): Payer: Self-pay

## 2023-08-16 MED ORDER — LEVONORGESTREL-ETHINYL ESTRAD 0.15-30 MG-MCG PO TABS
1.0000 | ORAL_TABLET | Freq: Every day | ORAL | 4 refills | Status: AC
  Filled 2023-08-16: qty 84, 84d supply, fill #0
  Filled 2023-11-10: qty 84, 84d supply, fill #1
  Filled 2024-02-03: qty 28, 28d supply, fill #2
  Filled 2024-03-03: qty 28, 28d supply, fill #3
  Filled 2024-03-30: qty 28, 28d supply, fill #4
  Filled 2024-04-27: qty 28, 28d supply, fill #5
  Filled 2024-05-26: qty 28, 28d supply, fill #6
  Filled 2024-06-26: qty 28, 28d supply, fill #7

## 2023-11-04 ENCOUNTER — Other Ambulatory Visit: Payer: Managed Care, Other (non HMO)

## 2023-11-06 ENCOUNTER — Encounter: Payer: Managed Care, Other (non HMO) | Admitting: Family Medicine

## 2023-11-19 ENCOUNTER — Other Ambulatory Visit: Payer: Self-pay | Admitting: Family

## 2023-11-19 DIAGNOSIS — N6311 Unspecified lump in the right breast, upper outer quadrant: Secondary | ICD-10-CM

## 2023-11-22 ENCOUNTER — Ambulatory Visit
Admission: RE | Admit: 2023-11-22 | Discharge: 2023-11-22 | Disposition: A | Discharge status: Home / Self Care | Source: Ambulatory Visit | Attending: Family | Admitting: Family

## 2023-11-22 DIAGNOSIS — N6311 Unspecified lump in the right breast, upper outer quadrant: Secondary | ICD-10-CM

## 2024-02-04 ENCOUNTER — Other Ambulatory Visit (HOSPITAL_BASED_OUTPATIENT_CLINIC_OR_DEPARTMENT_OTHER): Payer: Self-pay

## 2024-04-03 ENCOUNTER — Other Ambulatory Visit

## 2024-04-21 ENCOUNTER — Ambulatory Visit
Admission: RE | Admit: 2024-04-21 | Discharge: 2024-04-21 | Disposition: A | Discharge status: Home / Self Care | Source: Ambulatory Visit | Attending: Obstetrics and Gynecology | Admitting: Obstetrics and Gynecology

## 2024-04-21 DIAGNOSIS — Z9189 Other specified personal risk factors, not elsewhere classified: Secondary | ICD-10-CM

## 2024-04-21 MED ORDER — GADOPICLENOL 0.5 MMOL/ML IV SOLN
6.0000 mL | Freq: Once | INTRAVENOUS | Status: AC | PRN
  Administered 2024-04-21: 6 mL via INTRAVENOUS
